# Patient Record
Sex: Female | Born: 1985
Health system: Southern US, Community
[De-identification: ages and names within clinical notes are randomized; demographics above are authoritative.]

## PROBLEM LIST (undated history)

## (undated) ENCOUNTER — Inpatient Hospital Stay (HOSPITAL_COMMUNITY): Payer: Self-pay

## (undated) DIAGNOSIS — N83209 Unspecified ovarian cyst, unspecified side: Secondary | ICD-10-CM

## (undated) DIAGNOSIS — D649 Anemia, unspecified: Secondary | ICD-10-CM

## (undated) DIAGNOSIS — Z789 Other specified health status: Secondary | ICD-10-CM

## (undated) HISTORY — PX: OVARIAN CYST SURGERY: SHX726

---
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
## 2004-06-11 ENCOUNTER — Emergency Department (HOSPITAL_COMMUNITY): Admission: EM | Admit: 2004-06-11 | Discharge: 2004-06-11 | Payer: Self-pay | Admitting: Emergency Medicine

## 2004-07-08 ENCOUNTER — Emergency Department (HOSPITAL_COMMUNITY): Admission: EM | Admit: 2004-07-08 | Discharge: 2004-07-08 | Payer: Self-pay | Admitting: Emergency Medicine

## 2004-12-26 ENCOUNTER — Emergency Department (HOSPITAL_COMMUNITY): Admission: EM | Admit: 2004-12-26 | Discharge: 2004-12-27 | Payer: Self-pay | Admitting: Emergency Medicine

## 2005-09-11 ENCOUNTER — Ambulatory Visit (HOSPITAL_COMMUNITY): Admission: RE | Admit: 2005-09-11 | Discharge: 2005-09-11 | Payer: Self-pay | Admitting: Obstetrics

## 2005-10-27 ENCOUNTER — Ambulatory Visit (HOSPITAL_COMMUNITY): Admission: RE | Admit: 2005-10-27 | Discharge: 2005-10-27 | Payer: Self-pay | Admitting: Obstetrics

## 2006-02-02 ENCOUNTER — Ambulatory Visit (HOSPITAL_COMMUNITY): Admission: RE | Admit: 2006-02-02 | Discharge: 2006-02-02 | Payer: Self-pay | Admitting: Obstetrics

## 2006-02-07 ENCOUNTER — Inpatient Hospital Stay (HOSPITAL_COMMUNITY): Admission: AD | Admit: 2006-02-07 | Discharge: 2006-02-08 | Payer: Self-pay | Admitting: Obstetrics

## 2006-02-08 ENCOUNTER — Inpatient Hospital Stay (HOSPITAL_COMMUNITY): Admission: AD | Admit: 2006-02-08 | Discharge: 2006-02-08 | Payer: Self-pay | Admitting: Obstetrics

## 2006-02-19 ENCOUNTER — Inpatient Hospital Stay (HOSPITAL_COMMUNITY): Admission: AD | Admit: 2006-02-19 | Discharge: 2006-02-22 | Payer: Self-pay | Admitting: Obstetrics

## 2006-05-23 ENCOUNTER — Emergency Department (HOSPITAL_COMMUNITY): Admission: EM | Admit: 2006-05-23 | Discharge: 2006-05-24 | Payer: Self-pay | Admitting: Emergency Medicine

## 2006-10-23 ENCOUNTER — Emergency Department (HOSPITAL_COMMUNITY): Admission: EM | Admit: 2006-10-23 | Discharge: 2006-10-23 | Payer: Self-pay | Admitting: Emergency Medicine

## 2006-12-31 ENCOUNTER — Emergency Department (HOSPITAL_COMMUNITY): Admission: EM | Admit: 2006-12-31 | Discharge: 2006-12-31 | Payer: Self-pay | Admitting: Emergency Medicine

## 2007-01-10 ENCOUNTER — Emergency Department (HOSPITAL_COMMUNITY): Admission: EM | Admit: 2007-01-10 | Discharge: 2007-01-11 | Payer: Self-pay | Admitting: Emergency Medicine

## 2009-10-19 ENCOUNTER — Ambulatory Visit: Payer: Self-pay | Admitting: Family Medicine

## 2009-11-10 ENCOUNTER — Emergency Department (HOSPITAL_COMMUNITY): Admission: EM | Admit: 2009-11-10 | Discharge: 2009-11-10 | Payer: Self-pay | Admitting: Emergency Medicine

## 2010-01-27 ENCOUNTER — Encounter: Payer: Self-pay | Admitting: Physician Assistant

## 2010-01-27 ENCOUNTER — Emergency Department (HOSPITAL_COMMUNITY): Admission: EM | Admit: 2010-01-27 | Discharge: 2010-01-27 | Payer: Self-pay | Admitting: Emergency Medicine

## 2010-04-22 ENCOUNTER — Telehealth: Payer: Self-pay | Admitting: Physician Assistant

## 2010-04-22 ENCOUNTER — Ambulatory Visit: Payer: Self-pay | Admitting: Physician Assistant

## 2010-04-22 DIAGNOSIS — N9489 Other specified conditions associated with female genital organs and menstrual cycle: Secondary | ICD-10-CM | POA: Insufficient documentation

## 2010-04-22 LAB — CONVERTED CEMR LAB
ALT: 14 units/L (ref 0–35)
AST: 18 units/L (ref 0–37)
Albumin: 4.5 g/dL (ref 3.5–5.2)
Alkaline Phosphatase: 63 units/L (ref 39–117)
Bilirubin Urine: NEGATIVE
CO2: 26 meq/L (ref 19–32)
Chloride: 106 meq/L (ref 96–112)
Glucose, Bld: 78 mg/dL (ref 70–99)
Glucose, Urine, Semiquant: NEGATIVE
Hemoglobin: 12.4 g/dL (ref 12.0–15.0)
Ketones, urine, test strip: NEGATIVE
Neutro Abs: 2.3 10*3/uL (ref 1.7–7.7)
Nitrite: NEGATIVE
Potassium: 4.5 meq/L (ref 3.5–5.3)
RBC: 4.21 M/uL (ref 3.87–5.11)
RDW: 13.3 % (ref 11.5–15.5)
Specific Gravity, Urine: >=1.03
TSH: 2.206 microintl units/mL (ref 0.350–4.500)
WBC: 5.4 10*3/uL (ref 4.0–10.5)
pH: 5.5

## 2010-04-23 ENCOUNTER — Encounter: Payer: Self-pay | Admitting: Physician Assistant

## 2010-04-25 ENCOUNTER — Ambulatory Visit: Payer: Self-pay | Admitting: Physician Assistant

## 2010-04-26 ENCOUNTER — Encounter: Payer: Self-pay | Admitting: Physician Assistant

## 2010-04-26 LAB — CONVERTED CEMR LAB
Amphetamine Screen, Ur: NEGATIVE
Barbiturate Quant, Ur: NEGATIVE
Cocaine Metabolites: NEGATIVE
Marijuana Metabolite: POSITIVE — AB
Opiate Screen, Urine: NEGATIVE
Phencyclidine (PCP): NEGATIVE
Propoxyphene: NEGATIVE

## 2010-04-29 ENCOUNTER — Ambulatory Visit (HOSPITAL_COMMUNITY): Admission: RE | Admit: 2010-04-29 | Discharge: 2010-04-29 | Payer: Self-pay | Admitting: Internal Medicine

## 2010-04-29 ENCOUNTER — Telehealth: Payer: Self-pay | Admitting: Physician Assistant

## 2010-04-30 ENCOUNTER — Telehealth: Payer: Self-pay | Admitting: Physician Assistant

## 2010-05-03 ENCOUNTER — Encounter: Payer: Self-pay | Admitting: Physician Assistant

## 2010-05-17 ENCOUNTER — Telehealth: Payer: Self-pay | Admitting: Physician Assistant

## 2010-06-04 ENCOUNTER — Ambulatory Visit: Payer: Self-pay | Admitting: Internal Medicine

## 2010-06-04 DIAGNOSIS — B9789 Other viral agents as the cause of diseases classified elsewhere: Secondary | ICD-10-CM | POA: Insufficient documentation

## 2010-06-19 ENCOUNTER — Ambulatory Visit (HOSPITAL_COMMUNITY): Admission: RE | Admit: 2010-06-19 | Discharge: 2010-06-19 | Payer: Self-pay | Admitting: Internal Medicine

## 2010-06-19 DIAGNOSIS — R1909 Other intra-abdominal and pelvic swelling, mass and lump: Secondary | ICD-10-CM | POA: Insufficient documentation

## 2010-06-27 ENCOUNTER — Ambulatory Visit: Payer: Self-pay | Admitting: Obstetrics and Gynecology

## 2010-08-08 ENCOUNTER — Ambulatory Visit: Payer: Self-pay | Admitting: Family Medicine

## 2010-08-08 ENCOUNTER — Ambulatory Visit (HOSPITAL_COMMUNITY): Admission: RE | Admit: 2010-08-08 | Discharge: 2010-08-08 | Payer: Self-pay | Admitting: Family Medicine

## 2010-08-26 ENCOUNTER — Ambulatory Visit: Payer: Self-pay | Admitting: Physician Assistant

## 2010-08-26 ENCOUNTER — Emergency Department (HOSPITAL_COMMUNITY)
Admission: EM | Admit: 2010-08-26 | Discharge: 2010-08-26 | Payer: Self-pay | Source: Home / Self Care | Admitting: Emergency Medicine

## 2010-10-22 NOTE — Letter (Signed)
Summary: *HSN Results Follow up  [facility]  [address] [place], [place] [address]   Phone: [phone]  Fax: [phone]      04/23/2010   Janice Carney [address] [place], [place]  [address]   Dear  [name],                            ____S.Drinkard,FNP   ____D. Gore,FNP       ____B. McPherson,MD   ____V. Rankins,MD    ____E. Mulberry,MD    ____N. Daphine Deutscher, FNP  ____D. Reche Dixon, MD    ____K. Philipp Deputy, MD    __x__S. Alben Spittle, PA-C     This letter is to inform you that your recent test(s):  _______Pap Smear    ___x____Lab Test     _______X-ray    ___x____ is normal  _______ requires a medication change  _______ requires a follow-up lab visit  _______ requires a follow-up visit with your Swannie Milius   Comments: Please call me if you do not receive a call about your referral to the gynecologist in the next 2 weeks.       _________________________________________________________ If you have any questions, please contact our office                     Sincerely,  Tereso Newcomer PA-C [facility]

## 2010-10-22 NOTE — Letter (Signed)
Summary: TRANSABDINAL & TRANSVAGINAL OF PELVIS  TRANSABDINAL & TRANSVAGINAL OF PELVIS   Imported By: Arta Bruce 05/14/2010 15:11:43  _____________________________________________________________________  External Attachment:    Type:   Image     Comment:   External Document

## 2010-10-22 NOTE — Letter (Signed)
Summary: RADIOLOGY//APPOINTMENT  RADIOLOGY//APPOINTMENT   Imported By: Arta Bruce 05/03/2010 14:26:53  _____________________________________________________________________  External Attachment:    Type:   Image     Comment:   External Document

## 2010-10-22 NOTE — Letter (Signed)
Summary: REFERRAL//GYNECOLOGIC//APPT DATE & TIME  REFERRAL//GYNECOLOGIC//APPT DATE & TIME   Imported By: Arta Bruce 05/17/2010 15:09:46  _____________________________________________________________________  External Attachment:    Type:   Image     Comment:   External Document

## 2010-10-22 NOTE — Progress Notes (Signed)
Summary: f/u Pelvic Ultrasound  Phone Note Outgoing Call   Summary of Call: Make sure she is referred to GYN. I still cannot find out who ordered this u/s.  I did not.  I have no idea how it got ordered.  An order was sent over from radiology because GYN was sending her over, but she has never seen GYN.  I am confused.  Regardless, arrange f/u pelvic and transvaginal ultrasound in 6 weeks.  Order in basket.  Tell patient she has a cyst that needs to be followed.  Also let her know we are working on the GYN referral. Initial call taken by: Brynda Rim,  May 17, 2010 2:54 PM  Follow-up for Phone Call        pt states she already had a u/s on 04/29/10  Additional Follow-up for Phone Call Additional follow up Details #1::        I know she had an u/s on 8/8. She needs another one in 6 weeks to follow up. Please make sure she is getting appt with GYN. Tereso Newcomer PA-C  May 20, 2010 1:47 PM     Additional Follow-up for Phone Call Additional follow up Details #2::    OK. FOLLOW UP U/S SCHEDULED FOR 06/19/10 @ 830. PT AWARE AND ORDER FAXED ALSO NORA HAS FAXED REFERRAL TODAY WAITING ON APPT DATE Follow-up by: Michelle Nasuti,  May 20, 2010 4:15 PM

## 2010-10-22 NOTE — Assessment & Plan Note (Signed)
Summary: ABDOMINAL PAINS///KT   Vital Signs:  Patient profile:   25 year old female Height:      62 inches Weight:      112 pounds BMI:     20.56 Temp:     97.6 degrees F Pulse rate:   65 / minute Pulse rhythm:   regular Resp:     18 per minute BP sitting:   113 / 75  (right arm) Cuff size:   regular  Vitals Entered By: Dutch Quint RN (April 22, 2010 10:00 AM) CC: new patient visit, abdominal pain for about .  periodically, previous ER visit in May Pain Assessment Patient in pain? no       Does patient need assistance? Functional Status Self care Ambulation Normal   Primary Care Provider:  Tereso Newcomer, PA-C  CC:  new patient visit, abdominal pain for about .  periodically, and previous ER visit in May.  History of Present Illness: New patient. Has a h/o pelvic pain for the last several mos.  Went to ED in May and had an ultrasound that demonstrated normal uterus and ovaries, but a question of pelvic congestion syndrome as documented by multiple pelvic veins. She was treat for possible PID at her visit to the ED.  But, her GC and chlamydia probes returned negative.  She never went to the  Health Dept after going to the ED.  It was rec. she see GYN, but never did.  Given doxycycline.  She developed nausea and vomiting and hand numbness with med.  All symptoms resolved since finishing med.  Pain worse with cycle.  Pain is bilateral.  Notes irreg menses her whole life.  Was having more irreg bleeding before going to ED.  Was also having bleeding with sex.  No longer having this.  Pain can occur at any time, but worse with cycles.  Pain worse with standing or walking.  Feels like pressure.  Notes an ache after sex at times as well.  Habits & Providers  Alcohol-Tobacco-Diet     Tobacco Status: quit  Exercise-Depression-Behavior     Drug Use: yes  Current Medications (verified): 1)  None  Allergies (verified): No Known Drug Allergies  Past History:  Past  Medical History: Pelvic Congestion Syndrome by vaginal ultrasound in 01/2010  Past Surgical History: Denies surgical history  Family History: HTN - mom Colon CA - dad in his 62s ? CAD - grandmother Breast CA - PGM  Social History: Former Smoker  a.  smoked from age 15 to 54 - 1ppd Alcohol use-no  Drug use-yes   a.  marijuana Single 1 child G1P1 + sexually active with one partner Smoking Status:  quit Drug Use:  yes  Review of Systems  The patient denies fever, dyspnea on exertion, melena, and hematochezia.    Physical Exam  General:  alert, well-developed, and well-nourished.   Head:  normocephalic and atraumatic.   Eyes:  pupils equal, pupils round, and pupils reactive to light.   Neck:  supple and no thyromegaly.   Lungs:  normal breath sounds, no crackles, and no wheezes.   Heart:  normal rate and regular rhythm.   Abdomen:  soft, normal bowel sounds, and no hepatomegaly.   + tend with palp bilat lower pelvis  Neurologic:  alert & oriented X3 and cranial nerves II-XII intact.   Psych:  normally interactive and good eye contact.     Impression & Recommendations:  Problem # 1:  PREVENTIVE HEALTH CARE (ICD-V70.0)  Orders: T-Comprehensive Metabolic Panel [phone]) T-CBC w/Diff [id number]) T-TSH [phone]) T-Urinalysis [id number]) T-Syphilis Test (RPR) [id number]) T-HIV Antibody  (Reflex) [id number]) T-Drug Screen-Urine, (single) ([id number])  Problem # 2:  SYNDROME, PELVIC CONGESTION (ICD-625.5) refer to gyn  Orders: T-Comprehensive Metabolic Panel ([id number]) T-CBC w/Diff ([id number]) Gynecologic Referral (Gyn)  Patient Instructions: 1)  You can take Tylenol 500 mg (Acetaminophen) 1-2 tabs by mouth every 6 hours as needed. 2)  You can take Ibuprofen 200 mg 2-3 tabs by mouth every 6-8 hours with food as needed for pain.  Try taking when your cycle starts to help the pain. 3)  Schedule a CPP (pap smear) with Lorin Picket if the gynecology  clinic does not do one. 4)  Someone should call you about seeing gynecology.  If you do not hear anything in 2 weeks, call me.  Laboratory Results   Urine Tests    Routine Urinalysis   Color: yellow Appearance: Cloudy Glucose: negative   (Normal Range: Negative) Bilirubin: negative   (Normal Range: Negative) Ketone: negative   (Normal Range: Negative) Spec. Gravity: >=1.030   (Normal Range: 1.003-1.035) Blood: negative   (Normal Range: Negative) pH: 5.5   (Normal Range: 5.0-8.0) Protein: negative   (Normal Range: Negative) Urobilinogen: 0.2   (Normal Range: 0-1) Nitrite: negative   (Normal Range: Negative) Leukocyte Esterace: trace   (Normal Range: Negative)

## 2010-10-22 NOTE — Progress Notes (Signed)
Summary: [facility] needs an order//gk  Phone Note From Other Clinic   Summary of Call: [facility] sent an order on Friday because she has been scheduled for today at 11:5 for an ultrasound vaginal and they cannot do it until they have the order.  If you have any further questions please contact [facility] office [phone] and fax [phone].  Please do this right away.   Alben Spittle PA-c  Initial call taken by: Manon Hilding,  April 29, 2010 10:10 AM  Follow-up for Phone Call        order faxed Follow-up by: [place] Shannon,  April 29, 2010 10:16 AM

## 2010-10-22 NOTE — Progress Notes (Signed)
Summary: transvag ultrasound  Phone Note Outgoing Call   Summary of Call: Please call GYN clinic and ask if they ordered the transvaginal u/s. I did not order it.  Initial call taken by: Tereso Newcomer PA-C,  April 30, 2010 9:13 PM  Follow-up for Phone Call        spoke with lady and she wants me to call and speak with an Antoinette @ 4:30.... Armenia Shannon  May 01, 2010 12:46 PM  Did you find out who ordered this test? Tereso Newcomer PA-C  May 02, 2010 5:20 PM   Additional Follow-up for Phone Call Additional follow up Details #1::        Results on your desk. Gaylyn Cheers RN  May 03, 2010 9:48 AM   I did not order this test. Please check with GYN clinic and make sure they ordered it.  They need the results because she needs f/u on this. Tereso Newcomer PA-C  May 03, 2010 2:35 PM     Additional Follow-up for Phone Call Additional follow up Details #2::    spoke with Antoniette in radiololgy and she let me know the pelvic exam was cancelled because there was a pelvic and transvaginal recently done on may 8 and they normal will just reelevate her within 6 months... she said their should be some recommendations at the bottom of report. but they did not referr her no where Follow-up by: Arm[place] Shannon,  May 06, 2010 9:12 AM  Additional Follow-up for Phone Call Additional follow up Details #3:: Details for Additional Follow-up Action Taken: Has she been referred to Gynecology yet? Please find out when she was seen.  Request office visit note and put on my desk. If not seen yet, she needs appt.  Please schedule. Tereso Newcomer PA-C  May 14, 2010 4:44 PM   she has not been referred to gyn yet...will make appt  Armenia Shannon  May 15, 2010 8:05 AM    Appended Document: transvag ultrasound spoke with radiology and she said pt needs pelvic mri or transvaginal u/s 6-12 weeks.... we have to schedule the appt not them.Marland KitchenMarland KitchenMarland Kitchen

## 2010-10-22 NOTE — Progress Notes (Signed)
  Phone Note Outgoing Call   Summary of Call: Please culture urine. Tereso Newcomer PA-C  April 22, 2010 5:50 PM   Follow-up for Phone Call        urine did not get sent.... pt will need to come in to give another urine Follow-up by: Arm[place] Shannon,  April 23, 2010 12:13 PM     Appended Document:  have her come in to repeat Tereso Newcomer PA-C  April 23, 2010 12:15 PM  pt is aware... Armenia Shannon  April 23, 2010 4:07 PM   Clinical Lists Changes

## 2010-10-22 NOTE — Progress Notes (Signed)
Summary: GYN referral - appt. 04/29/2010  Phone Note Outgoing Call   Summary of Call: Needs referral to GYN for pelvic congestion syndrome.  Initial call taken by: Tereso Newcomer PA-C,  April 22, 2010 5:51 PM  Follow-up for Phone Call        I MADE HER AN APPT 04-29-10 @ 11:15 AM @ [facility] PT AWARE OF THE APPT  Follow-up by: Cheryll Dessert,  April 25, 2010 4:56 PM

## 2010-10-22 NOTE — Assessment & Plan Note (Signed)
Summary: Viral Syndrome   Vital Signs:  Patient profile:   25 year old female Height:      62 inches Weight:      107 pounds BMI:     19.64 Temp:     97.8 degrees F oral Pulse rate:   80 / minute Pulse rhythm:   regular Resp:     18 per minute BP sitting:   117 / 78  (left arm) Cuff size:   regular  Vitals Entered By: CMA Student [name] CC: office visit for vomitting , conjestion and soar throat onset for two days, medications verified Is Patient Diabetic? No Pain Assessment Patient in pain? no       Does patient need assistance? Functional Status Self care Ambulation Normal   Primary Care [name]:  Tereso Newcomer, PA-C  CC:  office visit for vomitting , conjestion and soar throat onset for two days, and medications verified.  History of Present Illness: Sick since yesterday.  No fever.  Notes vomiting (3x today).  No diarrhea.  No hematemesis.  No melena or hematochezia.  Notes some cramping.  Took some cough syrup.   + cough + congestion No sputum production.   + sore throat No rashes. No otalgia. Son sick over weekend.  Current Medications (verified): 1)  None  Allergies (verified): No Known Drug Allergies  Physical Exam  General:  WN/WD female who obviously feels poorly; lying on bed as I walk in room Head:  normocephalic and atraumatic.   Eyes:  pupils equal, pupils round, and pupils reactive to light.   Ears:  R ear normal and L ear normal.   Nose:  no nasal discharge.   Mouth:  pharynx pink and moist, no erythema, and no exudates.   Neck:  supple and no cervical lymphadenopathy.   Lungs:  normal breath sounds, no crackles, and no wheezes.   Heart:  normal rate and regular rhythm.   Abdomen:  soft, non-tender, normal bowel sounds, and no hepatomegaly.   Neurologic:  alert & oriented X3 and cranial nerves II-XII intact.   Psych:  normally interactive.     Impression & Recommendations:  Problem # 1:  VIRAL INFECTION  (ICD-079.99)  symptomatic tx f/u as needed  Her updated medication list for this problem includes:    Zyrtec-d Allergy & Congestion 5-120 Mg Xr12h-tab (Cetirizine-pseudoephedrine) .Marland Kitchen... Take 1 tablet by mouth two times a day as needed for congestion  Orders: Rapid Strep ([id number])  Complete Medication List: 1)  Promethazine Hcl 25 Mg Tabs (Promethazine hcl) .... 1/2 to 1 tab by mouth once daily as needed for nausea 2)  Zyrtec-d Allergy & Congestion 5-120 Mg Xr12h-tab (Cetirizine-pseudoephedrine) .... Take 1 tablet by mouth two times a day as needed for congestion  Patient Instructions: 1)  Take 650 - 1000 mg of tylenol every 4-6 hours as needed for relief of pain or comfort of fever. Avoid taking more than 4000 mg in a 24 hour period( can cause liver damage in higher doses).  2)  Your cough syrup is ok to use as needed. 3)  Use zyrtec-D two times a day as needed for congestion and drainaged. 4)  Use the phenergan for nausea if needed. 5)  Get plenty of rest.  Drink plenty of fluids. 6)  Maintain a clear liquid diet for 1 day.   7)  Advance to a BRAT diet - 8)  B-bananas 9)  R-rice 10)  A-apples 11)  T-toast 12)  Do this for 1-2  days. 13)  Advance your diet slowly as you can tolerate. 14)  Your symptoms should gradually get better over the next 7-10 days. 15)  Follow up with Lorin Picket if your symptoms do not get better or sooner if you feel worse. Prescriptions: ZYRTEC-D ALLERGY & CONGESTION 5-120 MG XR12H-TAB (CETIRIZINE-PSEUDOEPHEDRINE) Take 1 tablet by mouth two times a day as needed for congestion  #30 x 0   Entered and Authorized by:   Tereso Newcomer PA-C   Signed by:   Tereso Newcomer PA-C on 06/04/2010   Method used:   Print then Give to Patient   RxID:   [id number] PROMETHAZINE HCL 25 MG TABS (PROMETHAZINE HCL) 1/2 to 1 tab by mouth once daily as needed for nausea  #10 x 0   Entered and Authorized by:   Tereso Newcomer PA-C   Signed by:   Tereso Newcomer PA-C on 06/04/2010   Method  used:   Print then Give to Patient   RxID:   [id number]

## 2010-11-13 ENCOUNTER — Ambulatory Visit (INDEPENDENT_AMBULATORY_CARE_PROVIDER_SITE_OTHER): Payer: Self-pay | Admitting: Family Medicine

## 2010-11-13 DIAGNOSIS — N949 Unspecified condition associated with female genital organs and menstrual cycle: Secondary | ICD-10-CM

## 2010-12-02 LAB — POCT PREGNANCY, URINE: Preg Test, Ur: NEGATIVE

## 2010-12-03 LAB — SURGICAL PCR SCREEN
MRSA, PCR: NEGATIVE
Staphylococcus aureus: NEGATIVE

## 2010-12-03 LAB — CBC
HCT: 38.5 % (ref 36.0–46.0)
Hemoglobin: 13 g/dL (ref 12.0–15.0)
MCH: 30.6 pg (ref 26.0–34.0)
RBC: 4.24 MIL/uL (ref 3.87–5.11)
RDW: 13.5 % (ref 11.5–15.5)

## 2010-12-10 LAB — URINALYSIS, ROUTINE W REFLEX MICROSCOPIC: Specific Gravity, Urine: 1.035 — ABNORMAL HIGH (ref 1.005–1.030)

## 2010-12-10 LAB — URINE MICROSCOPIC-ADD ON

## 2010-12-10 LAB — WET PREP, GENITAL: Yeast Wet Prep HPF POC: NONE SEEN

## 2010-12-10 LAB — POCT PREGNANCY, URINE: Preg Test, Ur: NEGATIVE

## 2010-12-11 ENCOUNTER — Ambulatory Visit (INDEPENDENT_AMBULATORY_CARE_PROVIDER_SITE_OTHER): Payer: Self-pay | Admitting: Obstetrics & Gynecology

## 2010-12-11 DIAGNOSIS — N949 Unspecified condition associated with female genital organs and menstrual cycle: Secondary | ICD-10-CM

## 2010-12-13 NOTE — Progress Notes (Signed)
Janice Carney, Janice Carney              ACCOUNT NO.:  [id number]  MEDICAL RECORD NO.:  [id number]           PATIENT TYPE:  A  LOCATION:  [facility]                   FACILITY:  [facility]  PHYSICIAN:  Tinnie Gens, MD        DATE OF BIRTH:  [birth year]  DATE OF SERVICE:  11/13/2010                                 CLINIC NOTE  CHIEF COMPLAINT:  Pelvic pain.  HISTORY OF PRESENT ILLNESS:  The patient is a 25 year old gravida 1, para 1 who underwent diagnostic laparoscopy with I and D of right hydrosalpinx in November. She continues to have left lower quadrant pain which she had previously with surgery, that seems to be worse when she is up and working.  Review of her surgery and pictures reveals normal- appearing pelvic anatomy, especially on the left side.  I am unclear that this is related to her pain.  She is wondering if something at the time of surgery could be causing her pain, however, pain was present prior to surgery.  PHYSICAL EXAMINATION:  VITAL SIGNS:  As noted in the chart. GENERAL:  She is a well-developed, well-nourished female in no acute distress. ABDOMEN:  Soft, nontender, nondistended. GU:  Normal external female genitalia.  BUS normal.  Vagina is pink and rugated.  Cervix is parous without lesion.  Uterus is small retroverted. No adnexal mass.  The patient reports some mild tenderness on exam.  IMPRESSION:  Left-sided chronic pelvic pain of unclear etiology.  PLAN:  Trial of antiinflammatories x2-4 weeks.  If this fails to significantly improve her pain, we would consider GI and Urology referral. Discussed musculoskeletal as possible etiology of her pain and I am unclear if this is at all related to anything GYN.          ______________________________ Tinnie Gens, MD    TP/MEDQ  D:  11/13/2010  T:  11/14/2010  Job:  [id number]

## 2010-12-13 NOTE — Progress Notes (Signed)
NAMECATHY, CROUNSE              ACCOUNT NO.:  [id number]  MEDICAL RECORD NO.:  [id number]           PATIENT TYPE:  A  LOCATION:  [facility]                   FACILITY:  [facility]  PHYSICIAN:  Scheryl Darter, MD       DATE OF BIRTH:  1986-02-03  DATE OF SERVICE:  12/11/2010                                 CLINIC NOTE  The patient returns today in followup for her left lower quadrant pain. The patient has a history of left lower quadrant pain and she had diagnostic laparoscopy in November.  She is 25 years old gravida 1, para 1, last menstrual period November 21, 2010.  Currently not using any contraception.  She is somewhat ambivalent about whether she wants to conceive right now or not.  She occasionally has left lower quadrant pain which feels like pulling.  She says it is little bit less frequent now than when she was last seen here.  She says her last menstrual period came irregularly and somewhat heavy and painful.  She has a history of chronic pain and has had back problems and "pinched nerve" in the past.  She was seen by back specialist and chiropractor.  She no longer goes because she has no insurance.  It is unsure whether she would like to try chiropractic again.  She would like to, but the insurance issues could be a problem.  She will see the chiropractor if possible.  We discussed contraception and cycle control and I offered her to try oral contraceptives.  This should afford better cycle control and be good to see whether her pain is affected at all under this therapy.  She requested Ortho Tri-Cyclen.  I gave her prescription which she will start soon.  She will return in 3 months to review her progress.     Scheryl Darter, MD    JA/MEDQ  D:  12/11/2010  T:  12/12/2010  Job:  [id number]

## 2011-03-29 ENCOUNTER — Emergency Department (HOSPITAL_COMMUNITY)
Admission: EM | Admit: 2011-03-29 | Discharge: 2011-03-29 | Disposition: A | Discharge status: Home / Self Care | Payer: Self-pay | Attending: Emergency Medicine | Admitting: Emergency Medicine

## 2011-03-29 DIAGNOSIS — W540XXA Bitten by dog, initial encounter: Secondary | ICD-10-CM | POA: Insufficient documentation

## 2011-03-29 DIAGNOSIS — S8010XA Contusion of unspecified lower leg, initial encounter: Secondary | ICD-10-CM | POA: Insufficient documentation

## 2011-05-20 IMAGING — US US TRANSVAGINAL NON-OB
1 series · 13 of 25 positions shown · non-contrast
Comparison: 01/27/2010

CLINICAL DATA: Pelvic pain.

TRANSVAGINAL ULTRASOUND OF PELVIS
TECHNIQUE: Transvaginal ultrasound examination of the pelvis was
performed including evaluation of the uterus, ovaries, adnexal
regions, and pelvic cul-de-sac.

[Series 1: us transvaginal non-ob · 48 acquisitions, 13 frames shown]
[im 1/48]
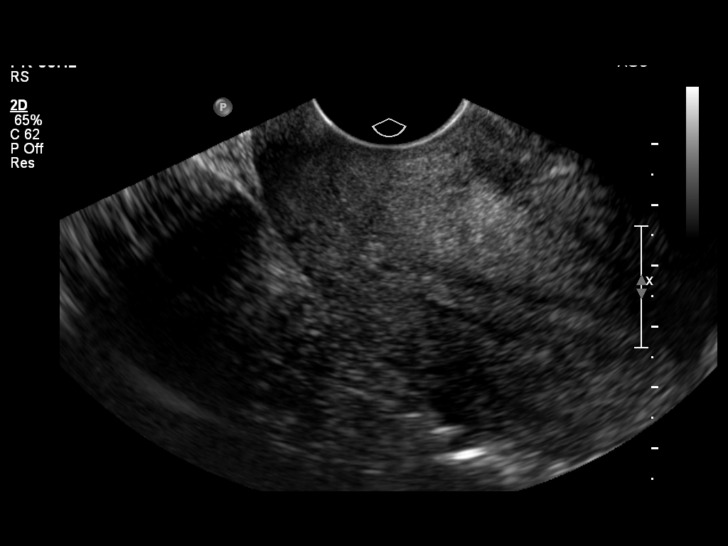
[im 4/48]
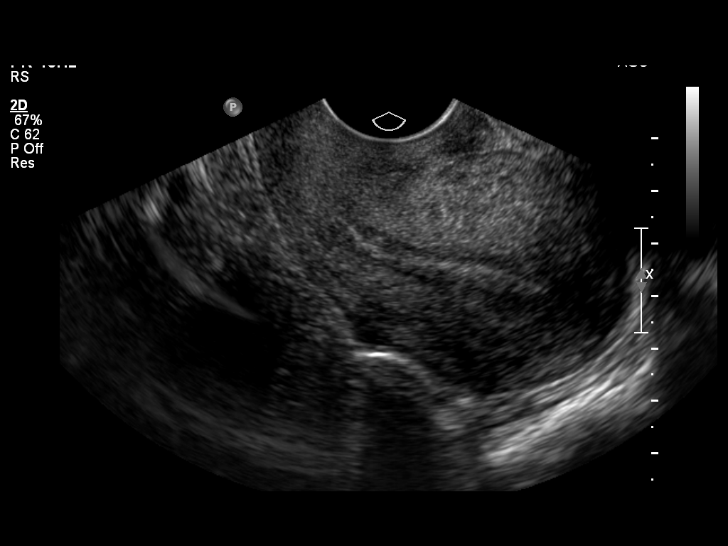
[im 8/48]
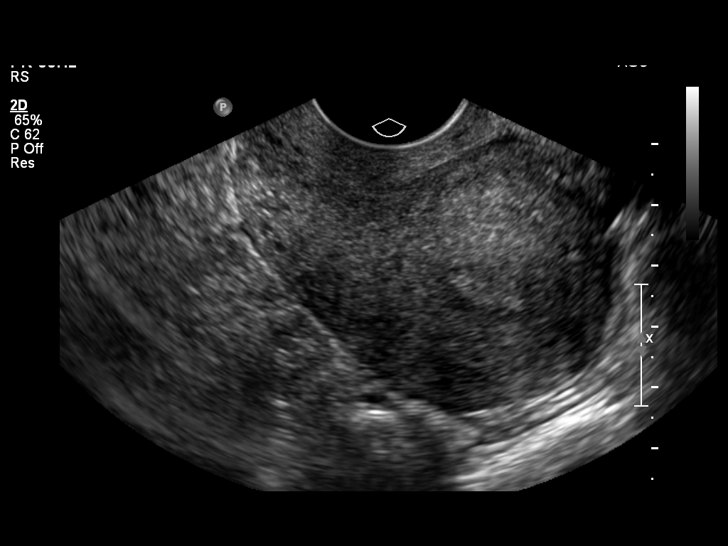
[im 12/48]
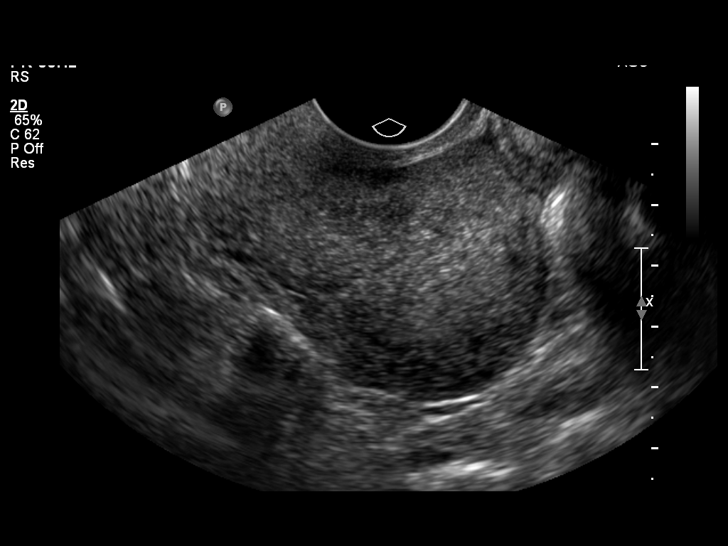
[im 16/48]
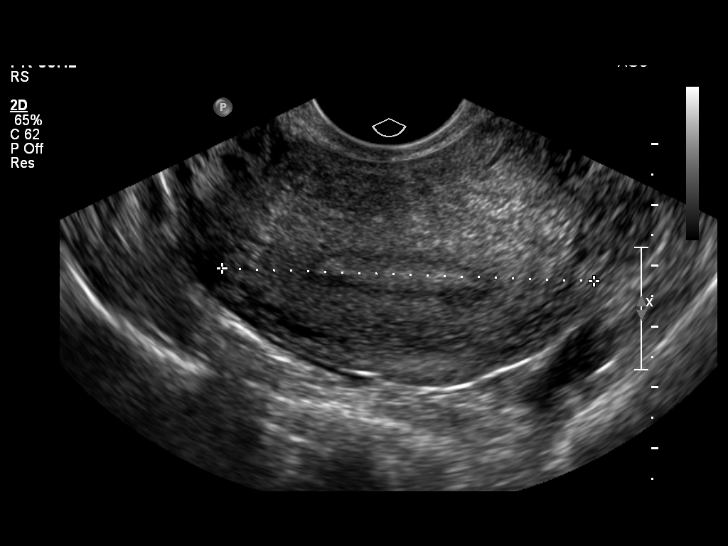
[im 20/48]
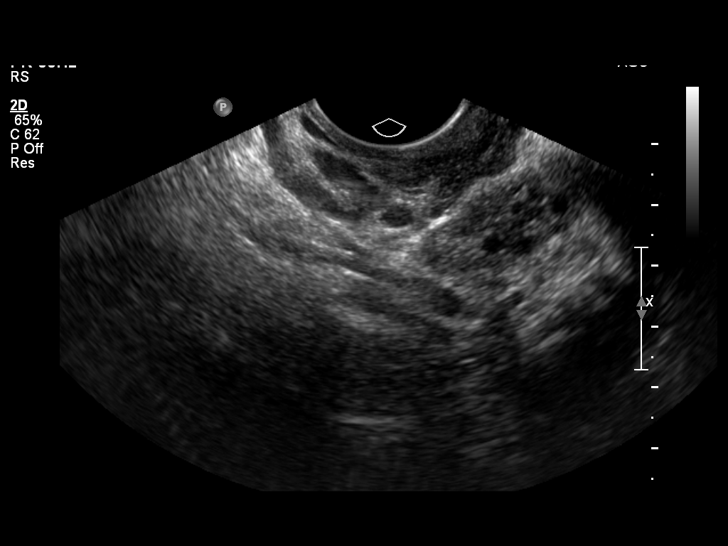
[im 24/48]
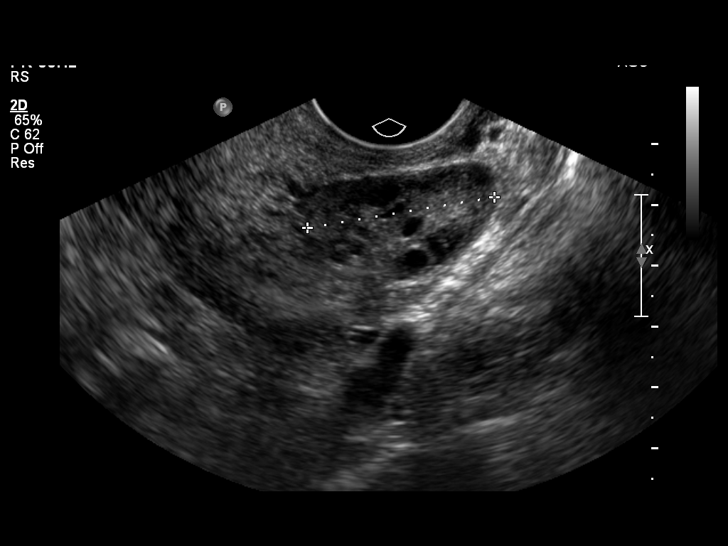
[im 28/48]
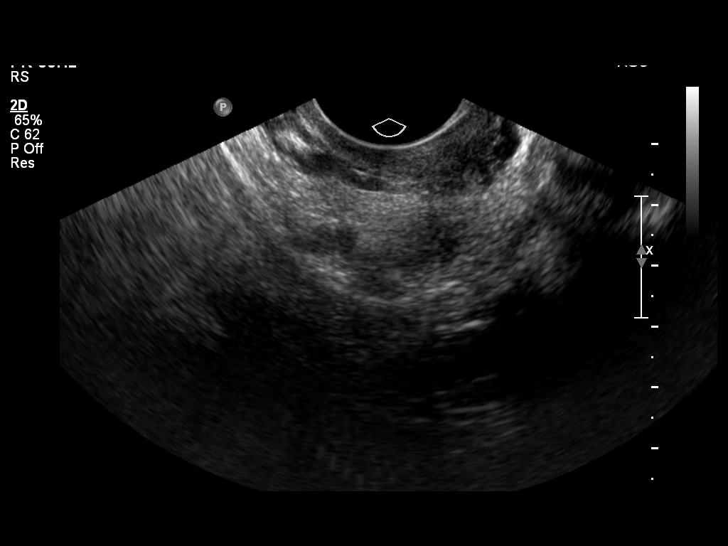
[im 32/48]
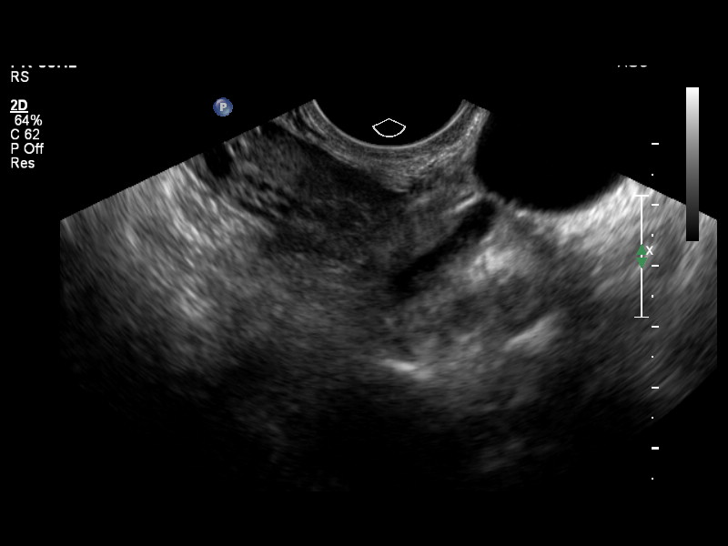
[im 36/48]
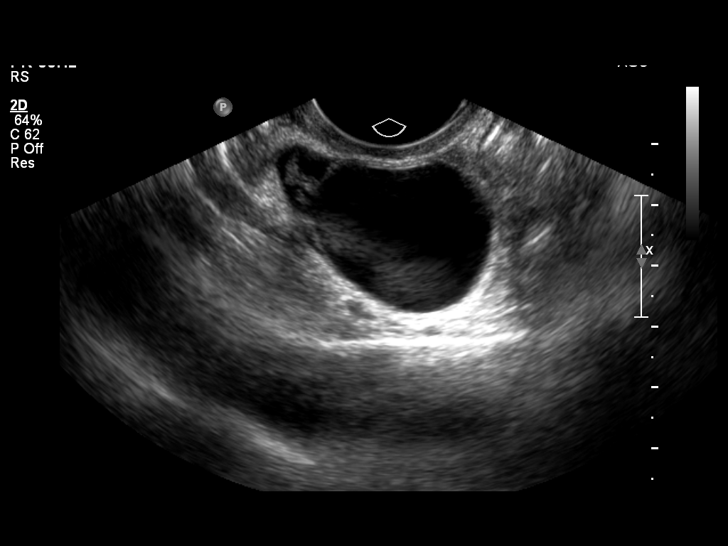
[im 40/48]
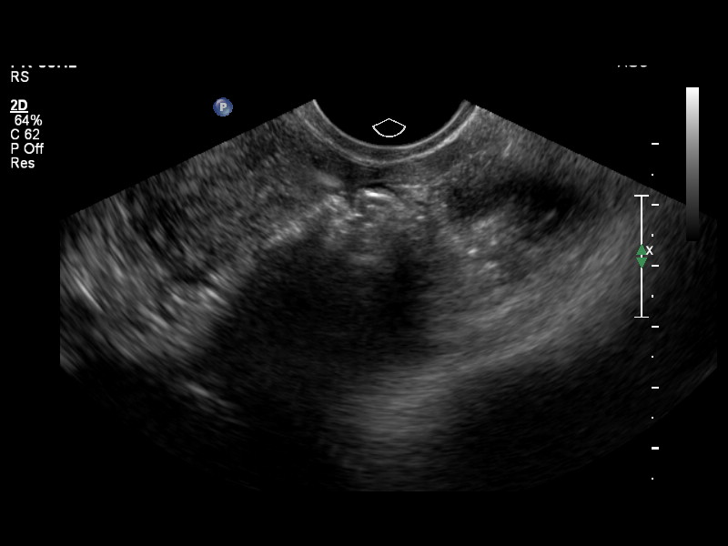
[im 44/48]
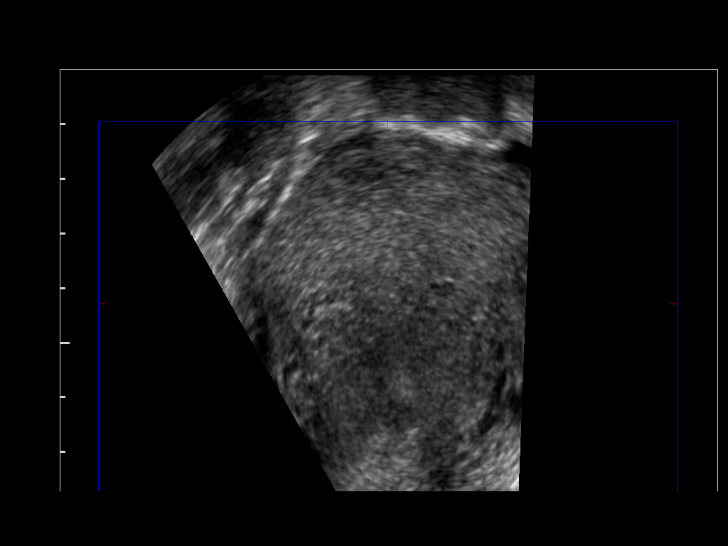
[im 48/48]
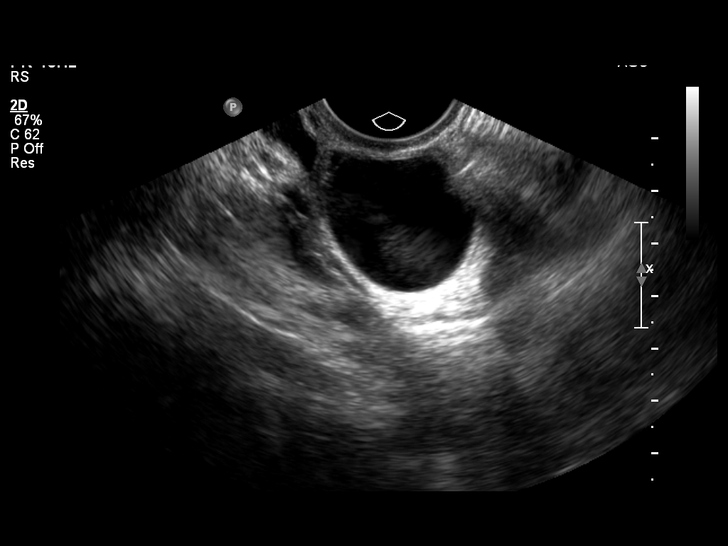

[13 of 25 positions shown; findings below may reference images not displayed]

FINDINGS: Uterus measures 8.1 x 4.6 by 6.1 cm.  Uterus is retroverted.  No
fibroids or other uterine masses identified.

Endometrium measures 4 mm in thickness.  Within normal limits in
appearance.

Right Ovary measures 4.3 x 1.8 x 2.6 cm.  Normal appearance.

Left Ovary measures 3.8 x 1.8 x 3.1 cm.  Normal appearance.

Other Findings:  A fluid filled structure is seen in the right
adnexa separate from the ovary which measures approximately 3 cm.
This has a several peripheral folds raising suspicion for
hydrosalpinx, although differential diagnosis also includes a
paraovarian cyst or peritoneal inclusion cyst.  No malignant
features are identified.
IMPRESSION: 1.  3 cm cystic lesion in the right adnexa which appears separate
from the right ovary, with differential diagnosis including
hydrosalpinx, paraovarian cyst, or peritoneal inclusion cyst.  No
malignant features are identified.  Recommend pelvic MRI without
with contrast for further evaluation, or alternatively, continued
follow-up by transvaginal ultrasound in 6-12 weeks.
2.  Normal appearance of the uterus and left ovary.

## 2011-07-20 ENCOUNTER — Inpatient Hospital Stay (HOSPITAL_COMMUNITY)
Admission: AD | Admit: 2011-07-20 | Discharge: 2011-07-20 | Disposition: A | Discharge status: Home / Self Care | Payer: Self-pay | Source: Ambulatory Visit | Attending: Obstetrics & Gynecology | Admitting: Obstetrics & Gynecology

## 2011-07-20 ENCOUNTER — Encounter (HOSPITAL_COMMUNITY): Payer: Self-pay | Admitting: *Deleted

## 2011-07-20 DIAGNOSIS — N912 Amenorrhea, unspecified: Secondary | ICD-10-CM | POA: Insufficient documentation

## 2011-07-20 HISTORY — DX: Other specified health status: Z78.9

## 2011-07-20 LAB — POCT PREGNANCY, URINE: Preg Test, Ur: NEGATIVE

## 2011-07-20 NOTE — ED Provider Notes (Signed)
Chief Complaint:  No chief complaint on file.   Janice Carney is  25 y.o. G1P1001.  Patient's last menstrual period was 04/07/2011.Marland Kitchen  Her pregnancy status is negative.  She presents complaining of no period since August, breast tenderness and nausea x 3-4 weeks.   Denies abd pain, back pain, abnormal blding, discharge or dysuria  Obstetrical/Gynecological History: OB History    Grav Para Term Preterm Abortions TAB SAB Ect Mult Living   1 1 1       1       Past Medical History: Past Medical History  Diagnosis Date  . No pertinent past medical history     Past Surgical History: Past Surgical History  Procedure Date  . Ovarian cyst surgery     Family History: Family History  Problem Relation Age of Onset  . Hypertension Mother   . Cancer Father   . Heart disease Maternal Grandmother     Social History: History  Substance Use Topics  . Smoking status: Former Smoker -- 5 years    Types: Cigarettes    Quit date: 07/19/2005  . Smokeless tobacco: Not on file  . Alcohol Use: Yes     socially when not pregnant    Allergies: No Known Allergies  Prescriptions prior to admission  Medication Sig Dispense Refill  . naproxen sodium (ANAPROX) 220 MG tablet Take 220 mg by mouth 2 (two) times daily as needed. For pain         Review of Systems - Negative except what has been reviewed in the HPI  Physical Exam   Blood pressure 138/80, pulse 84, temperature 98.2 F (36.8 C), temperature source Oral, resp. rate 16, height 5\' 3"  (1.6 m), weight 57.153 kg (126 lb), last menstrual period 04/07/2011.  General: General appearance - alert, well appearing, and in no distress, oriented to person, place, and time and normal appearing weight Mental status - alert, oriented to person, place, and time, normal mood, behavior, speech, dress, motor activity, and thought processes, affect appropriate to mood Abdomen - soft, nontender, nondistended, no masses or organomegaly Focused  Gynecological Exam: examination not indicated  Labs: Recent Results (from the past 24 hour(s))  POCT PREGNANCY, URINE   Collection Time   07/20/11  9:07 PM      Component Value Range   Preg Test, Ur NEGATIVE    HCG, SERUM, QUALITATIVE   Collection Time   07/20/11  9:24 PM      Component Value Range   Preg, Serum NEGATIVE  NEGATIVE      Assessment: Amenorrhea with negative preg testing  Plan: Discharge home FU with Gyn Clinic or Gyn provider for further evaluation of symptoms   Tawnee Clegg E. 07/20/2011,10:14 PM

## 2011-07-20 NOTE — Progress Notes (Signed)
Pthas had sore breast and  vomiting for the past three weeks.  Last Normal period was 16th July 2012.  No bleeding  Or urinary difficulties.

## 2011-07-22 NOTE — ED Provider Notes (Signed)
Agree with above note.  Janice Bluett H. 07/22/2011 7:52 PM  

## 2012-09-22 NOTE — L&D Delivery Note (Signed)
Delivery Note At 10:24 PM a viable female was delivered via Vaginal, Spontaneous Delivery (Presentation: LOA ).  APGAR: 8, 9; weight 7 lb 3 oz (3260 g).   Placenta status: Intact, Spontaneous, shultz.  Cord: 3 vessels with the following complications: None.  Cord pH: not collected  Pt at high risk of PPH, Cytotec given just after delivery for bleeding precautions.  Anesthesia: Local  Episiotomy:  Lacerations: 2nd degree - Vaginal; Labial Suture Repair: 3-0 and 4-0 Vicryl Est. Blood Loss (mL): 350  Mom to postpartum.  Baby to skin to skin immediately after delivery.  Routine PP orders Breastfeeding Circ - outpt   Dallys Nowakowski 06/18/2013, 12:20 AM

## 2012-11-05 ENCOUNTER — Encounter (HOSPITAL_COMMUNITY): Payer: Self-pay | Admitting: *Deleted

## 2012-11-05 ENCOUNTER — Inpatient Hospital Stay (HOSPITAL_COMMUNITY): Payer: Medicaid Other

## 2012-11-05 ENCOUNTER — Inpatient Hospital Stay (HOSPITAL_COMMUNITY)
Admission: AD | Admit: 2012-11-05 | Discharge: 2012-11-05 | Disposition: A | Discharge status: Home / Self Care | Payer: Medicaid Other | Source: Ambulatory Visit | Attending: Obstetrics & Gynecology | Admitting: Obstetrics & Gynecology

## 2012-11-05 DIAGNOSIS — R109 Unspecified abdominal pain: Secondary | ICD-10-CM | POA: Insufficient documentation

## 2012-11-05 DIAGNOSIS — N760 Acute vaginitis: Secondary | ICD-10-CM

## 2012-11-05 DIAGNOSIS — B9689 Other specified bacterial agents as the cause of diseases classified elsewhere: Secondary | ICD-10-CM

## 2012-11-05 DIAGNOSIS — O34599 Maternal care for other abnormalities of gravid uterus, unspecified trimester: Secondary | ICD-10-CM | POA: Insufficient documentation

## 2012-11-05 DIAGNOSIS — A499 Bacterial infection, unspecified: Secondary | ICD-10-CM

## 2012-11-05 DIAGNOSIS — N83209 Unspecified ovarian cyst, unspecified side: Secondary | ICD-10-CM | POA: Insufficient documentation

## 2012-11-05 DIAGNOSIS — O2390 Unspecified genitourinary tract infection in pregnancy, unspecified trimester: Secondary | ICD-10-CM | POA: Insufficient documentation

## 2012-11-05 LAB — URINALYSIS, ROUTINE W REFLEX MICROSCOPIC
Hgb urine dipstick: NEGATIVE
Nitrite: NEGATIVE
Protein, ur: NEGATIVE mg/dL
Specific Gravity, Urine: 1.015 (ref 1.005–1.030)
Urobilinogen, UA: 0.2 mg/dL (ref 0.0–1.0)
pH: 7 (ref 5.0–8.0)

## 2012-11-05 LAB — OB RESULTS CONSOLE GC/CHLAMYDIA: Chlamydia: NEGATIVE

## 2012-11-05 LAB — HCG, QUANTITATIVE, PREGNANCY: hCG, Beta Chain, Quant, S: 22797 m[IU]/mL — ABNORMAL HIGH (ref <5)

## 2012-11-05 LAB — WET PREP, GENITAL: Yeast Wet Prep HPF POC: NONE SEEN

## 2012-11-05 LAB — URINE MICROSCOPIC-ADD ON

## 2012-11-05 MED ORDER — METRONIDAZOLE 500 MG PO TABS: 500.0000 mg | ORAL_TABLET | Freq: Two times a day (BID) | ORAL | Status: DC

## 2012-11-05 NOTE — MAU Note (Signed)
Pt states that she had a cyst removed from her left side 2 years ago and stated having pain in her left side a few weeks ago and is concerned now because she is pregnant

## 2012-11-05 NOTE — MAU Provider Note (Signed)
History    CSN: [id number]  Arrival date and time: 11/05/12 1313   None    Chief Complaint  Patient presents with  . pain on left side    HPI Janice Carney is a 27 yo G2P1 female at [redacted]w[redacted]d gestation. The patient reports to the MAU complaining of left-sided abdominal pain. She reports that she has had this pain for 3 weeks. She reports that the pain comes and goes and is worse with activity. The patient reports that she works as a [employer] fast streamer, so getting in and out of the truck and heavy lifting are times when the pain is worse. She reports that if she is just sitting down and resting that she has very minimal, if any pain at all. She denies any associated symptoms such as nausea, vomiting, constipation or diarrhea. She also denies any vaginal discharge or bleeding.   She also reports that she has had a "cyst" on her ovary before. She reports that the cyst was removed in 2011, and reports that she has been pain free since then until about 3 weeks ago.   OB History   Grav Para Term Preterm Abortions TAB SAB Ect Mult Living   2 1 1       1       Past Medical History  Diagnosis Date  . No pertinent past medical history     Past Surgical History  Procedure Laterality Date  . Ovarian cyst surgery      Family History  Problem Relation Age of Onset  . Hypertension Mother   . Cancer Father   . Heart disease Maternal Grandmother     History  Substance Use Topics  . Smoking status: Former Smoker -- 5 years    Types: Cigarettes    Quit date: 07/19/2005  . Smokeless tobacco: Not on file  . Alcohol Use: Yes     Comment: socially when not pregnant    Allergies: No Known Allergies  No prescriptions prior to admission    Review of Systems  Constitutional: Negative for fever.  Gastrointestinal: Positive for abdominal pain (lower left side). Negative for nausea, vomiting, diarrhea and constipation.  Genitourinary: Negative for dysuria, urgency and frequency.    Physical  Exam   Blood pressure 116/58, pulse 89, temperature 97.6 F (36.4 C), temperature source Oral, resp. rate 18, height 5\' 4"  (1.626 m), weight 54.432 kg (120 lb), last menstrual period 09/21/2012.  Physical Exam Physical Examination: General appearance - alert, well appearing, and in no distress and oriented to person, place, and time Chest - clear to auscultation, no wheezes, rales or rhonchi, symmetric air entry Heart - normal rate, regular rhythm, normal S1, S2, no murmurs, rubs, clicks or gallops Abdomen - soft, tenderness noted over left-lower abdomen, no tenderness over right lower abdomen, nondistended, no masses or organomegaly Pelvic - normal external genitalia, vulva, vagina, cervix, uterus and adnexa, VULVA: normal appearing vulva with no masses, tenderness or lesions, VAGINA: normal appearing vagina with normal color and discharge, no lesions, CERVIX: normal appearing cervix with mild discharge, cervix without lesions, no cervical motion tenderness UTERUS: uterus is normal size, shape, consistency and nontender, ADNEXA: gravid uterus, normal size and shape of left adnexa, unable to feel right paraovarian cyst, tender left adnexa  Extremities - peripheral pulses normal, no pedal edema, no clubbing or cyanosis  MAU Course  Procedures  Results for orders placed during the hospital encounter of 11/05/12 (from the past 24 hour(s))  URINALYSIS, ROUTINE W REFLEX MICROSCOPIC  Status: Abnormal   Collection Time    11/05/12  1:26 PM      Result Value Range   Color, Urine YELLOW  YELLOW   APPearance CLEAR  CLEAR   Specific Gravity, Urine 1.015  1.005 - 1.030   pH 7.0  5.0 - 8.0   Glucose, UA NEGATIVE  NEGATIVE mg/dL   Hgb urine dipstick NEGATIVE  NEGATIVE   Bilirubin Urine NEGATIVE  NEGATIVE   Ketones, ur NEGATIVE  NEGATIVE mg/dL   Protein, ur NEGATIVE  NEGATIVE mg/dL   Urobilinogen, UA 0.2  0.0 - 1.0 mg/dL   Nitrite NEGATIVE  NEGATIVE   Leukocytes, UA TRACE (*) NEGATIVE  URINE  MICROSCOPIC-ADD ON     Status: Abnormal   Collection Time    11/05/12  1:26 PM      Result Value Range   Squamous Epithelial / LPF MANY (*) RARE   WBC, UA 3-6  <3 WBC/hpf   RBC / HPF 0-2  <3 RBC/hpf   Bacteria, UA MANY (*) RARE   Urine-Other MUCOUS PRESENT    POCT PREGNANCY, URINE     Status: Abnormal   Collection Time    11/05/12  1:34 PM      Result Value Range   Preg Test, Ur POSITIVE (*) NEGATIVE  HCG, QUANTITATIVE, PREGNANCY     Status: Abnormal   Collection Time    11/05/12  1:44 PM      Result Value Range   hCG, Beta Chain, Quant, S 22797 (*) <5 mIU/mL    Assessment and Plan    Adela Glimpse 11/05/2012, 3:05 PM   1. BV (bacterial vaginosis)   2. Ovarian cyst in pregnancy, first trimester     No current facility-administered medications on file prior to encounter.   No current outpatient prescriptions on file prior to encounter.     Follow-up Information   Follow up with provider of your choice. (start prenatal care as soon as possible)      I saw and examined patient along with student and agree with above note.   Breahna Boylen 11/05/2012 5:26 PM

## 2012-11-06 LAB — URINE CULTURE: Colony Count: NO GROWTH

## 2012-11-08 NOTE — MAU Provider Note (Signed)
Attestation of Attending Supervision of Advanced Practitioner (PA/CNM/NP): Evaluation and management procedures were performed by the Advanced Practitioner under my supervision and collaboration.  I have reviewed the Advanced Practitioner's note and chart, and I agree with the management and plan. BV treated with Metronidazole, U/S showed 4.3 cm R paraovarian cyst.  Will continue to monitor, no intervention needed at this point.  Jaynie Collins, MD, FACOG Attending Obstetrician & Gynecologist Faculty Practice, [facility]

## 2013-01-11 LAB — OB RESULTS CONSOLE HEPATITIS B SURFACE ANTIGEN: Hepatitis B Surface Ag: NEGATIVE

## 2013-01-11 LAB — OB RESULTS CONSOLE RUBELLA ANTIBODY, IGM: Rubella: IMMUNE

## 2013-01-11 LAB — OB RESULTS CONSOLE RPR: RPR: NONREACTIVE

## 2013-01-11 LAB — OB RESULTS CONSOLE HIV ANTIBODY (ROUTINE TESTING): HIV: NONREACTIVE

## 2013-03-04 ENCOUNTER — Encounter (HOSPITAL_COMMUNITY): Payer: Self-pay | Admitting: Emergency Medicine

## 2013-03-04 DIAGNOSIS — K089 Disorder of teeth and supporting structures, unspecified: Secondary | ICD-10-CM | POA: Insufficient documentation

## 2013-03-04 DIAGNOSIS — Z87891 Personal history of nicotine dependence: Secondary | ICD-10-CM | POA: Insufficient documentation

## 2013-03-04 DIAGNOSIS — R6883 Chills (without fever): Secondary | ICD-10-CM | POA: Insufficient documentation

## 2013-03-04 DIAGNOSIS — O99891 Other specified diseases and conditions complicating pregnancy: Secondary | ICD-10-CM | POA: Insufficient documentation

## 2013-03-04 NOTE — ED Notes (Signed)
PT. REPORTS LEFT UPPER MOLAR PAIN WITH CAVITY ONSET YESTERDAY UNRELIEVED BY OTC TYLENOL , PT. STATED SHE IS [redacted] WEEKS PREGNANT ( G2P1) .

## 2013-03-05 ENCOUNTER — Emergency Department (HOSPITAL_COMMUNITY)
Admission: EM | Admit: 2013-03-05 | Discharge: 2013-03-05 | Disposition: A | Discharge status: Home / Self Care | Payer: Medicaid Other | Attending: Emergency Medicine | Admitting: Emergency Medicine

## 2013-03-05 ENCOUNTER — Encounter (HOSPITAL_COMMUNITY): Payer: Self-pay | Admitting: Emergency Medicine

## 2013-03-05 DIAGNOSIS — K0889 Other specified disorders of teeth and supporting structures: Secondary | ICD-10-CM

## 2013-03-05 MED ORDER — HYDROCODONE-ACETAMINOPHEN 5-325 MG PO TABS
2.0000 | ORAL_TABLET | Freq: Once | ORAL | Status: AC
  Administered 2013-03-05: 2 via ORAL
  Filled 2013-03-05: qty 2

## 2013-03-05 MED ORDER — HYDROCODONE-ACETAMINOPHEN 5-325 MG PO TABS: ORAL_TABLET | ORAL | Status: DC

## 2013-03-05 MED ORDER — PENICILLIN V POTASSIUM 500 MG PO TABS: 500.0000 mg | ORAL_TABLET | Freq: Three times a day (TID) | ORAL | Status: DC

## 2013-03-05 NOTE — Discharge Instructions (Signed)
 Dental Pain A tooth ache may be caused by cavities (tooth decay). Cavities expose the nerve of the tooth to air and hot or cold temperatures. It may come from an infection or abscess (also called a boil or furuncle) around your tooth. It is also often caused by dental caries (tooth decay). This causes the pain you are having. DIAGNOSIS  Your caregiver can diagnose this problem by exam. TREATMENT   If caused by an infection, it may be treated with medications which kill germs (antibiotics) and pain medications as prescribed by your caregiver. Take medications as directed.  Only take over-the-counter or prescription medicines for pain, discomfort, or fever as directed by your caregiver.  Whether the tooth ache today is caused by infection or dental disease, you should see your dentist as soon as possible for further care. SEEK MEDICAL CARE IF: The exam and treatment you received today has been provided on an emergency basis only. This is not a substitute for complete medical or dental care. If your problem worsens or new problems (symptoms) appear, and you are unable to meet with your dentist, call or return to this location. SEEK IMMEDIATE MEDICAL CARE IF:   You have a fever.  You develop redness and swelling of your face, jaw, or neck.  You are unable to open your mouth.  You have severe pain uncontrolled by pain medicine. MAKE SURE YOU:   Understand these instructions.  Will watch your condition.  Will get help right away if you are not doing well or get worse. Document Released: 09/08/2005 Document Revised: 12/01/2011 Document Reviewed: 04/26/2008 [employer] [employer] [facility] Patient Information 2014 [place], [place].    Narcotic and benzodiazepine use may cause drowsiness, slowed breathing or dependence.  Please use with caution and do not drive, operate machinery or watch young children alone while taking them.  Taking combinations of these medications or drinking alcohol will potentiate these  effects.

## 2013-03-05 NOTE — ED Provider Notes (Signed)
History     CSN: [id number]  Arrival date & time 03/04/13  2330   First MD Initiated Contact with Patient 03/05/13 0128      Chief Complaint  Patient presents with  . Dental Pain    (Consider location/radiation/quality/duration/timing/severity/associated sxs/prior treatment) HPI Comments: Pt is currently pregnant, reports her oral surgeon needed to wait until her pregnancy was further along to do xrays, then to plan dental extraction.  She reports her dentist thought most of her upper left teeth needed to be extracted.  Reports has plans to have tooth or teeth extracted by oral surgeon, but not for weeks.  No complications from pregnancy.  Patient is a 27 y.o. female presenting with tooth pain. The history is provided by the patient.  Dental Pain Location:  Upper Upper teeth location:  15/LU 2nd molar Quality:  Dull, shooting, pulsating and aching Severity:  Severe Onset quality:  Gradual Duration:  2 months Timing:  Sporadic Progression:  Worsening Chronicity:  Recurrent Context: poor dentition   Context: not abscess, not dental fracture, not enamel fracture, filling still in place, not recent dental surgery and not trauma   Previous work-up:  Dental exam Relieved by:  Nothing Ineffective treatments:  Acetaminophen Associated symptoms: no neck pain   Risk factors: smoking     Past Medical History  Diagnosis Date  . No pertinent past medical history     Past Surgical History  Procedure Laterality Date  . Ovarian cyst surgery      Family History  Problem Relation Age of Onset  . Hypertension Mother   . Cancer Father   . Heart disease Maternal Grandmother     History  Substance Use Topics  . Smoking status: Former Smoker -- 5 years    Types: Cigarettes    Quit date: 07/19/2005  . Smokeless tobacco: Not on file  . Alcohol Use: Yes     Comment: socially when not pregnant    OB History   Grav Para Term Preterm Abortions TAB SAB Ect Mult Living   2 1 1        1       Review of Systems  Constitutional: Positive for chills. Negative for appetite change.  HENT: Positive for dental problem. Negative for trouble swallowing, neck pain and voice change.   Respiratory: Negative for shortness of breath.   Cardiovascular: Negative for chest pain.  Gastrointestinal: Negative for nausea, vomiting and abdominal pain.    Allergies  Review of patient's allergies indicates no known allergies.  Home Medications   Current Outpatient Rx  Name  Route  Sig  Dispense  Refill  . acetaminophen (TYLENOL) 325 MG tablet   Oral   Take 650 mg by mouth every 6 (six) hours as needed for pain.         . Prenatal Vit-Fe Fumarate-FA (PRENATAL MULTIVITAMIN) TABS   Oral   Take 1 tablet by mouth daily at 12 noon.         Marland Kitchen HYDROcodone-acetaminophen (NORCO/VICODIN) 5-325 MG per tablet      1-2 tablets po q 6 hours prn moderate to severe pain   20 tablet   0   . penicillin v potassium (VEETID) 500 MG tablet   Oral   Take 1 tablet (500 mg total) by mouth 3 (three) times daily.   30 tablet   0     BP 123/66  Temp(Src) 97.9 F (36.6 C) (Oral)  Resp 18  SpO2 97%  LMP 09/21/2012  Physical Exam  Nursing note and vitals reviewed. Constitutional: She appears well-developed and well-nourished. No distress.  HENT:  Head: Normocephalic and atraumatic.  Mouth/Throat: Uvula is midline, oropharynx is clear and moist and mucous membranes are normal.    No visible oral ulcerations, rash, erythema, pus, fluctuance.   Eyes: EOM are normal.  Neck: Neck supple.  Pulmonary/Chest: Effort normal. No respiratory distress.  Abdominal: Soft. She exhibits no distension.  gravid  Neurological: She is alert.  Skin: Skin is warm and dry. No rash noted. No pallor.    ED Course  Procedures (including critical care time)  Labs Reviewed - No data to display No results found.   1. Pain, dental       MDM  Will give analgesics and PCN.  Pt agreeable to take  narcotics despite category C medication.  Pt instructed to follow up with oral surgeon earlier.          Gavin Pound. Oletta Lamas, MD 03/05/13 4098

## 2013-04-12 ENCOUNTER — Inpatient Hospital Stay (HOSPITAL_COMMUNITY)
Admission: AD | Admit: 2013-04-12 | Discharge: 2013-04-12 | Disposition: A | Discharge status: Home / Self Care | Payer: Medicaid Other | Source: Ambulatory Visit | Attending: Obstetrics and Gynecology | Admitting: Obstetrics and Gynecology

## 2013-04-12 ENCOUNTER — Inpatient Hospital Stay (HOSPITAL_COMMUNITY): Payer: Medicaid Other

## 2013-04-12 ENCOUNTER — Encounter (HOSPITAL_COMMUNITY): Payer: Self-pay | Admitting: *Deleted

## 2013-04-12 DIAGNOSIS — N83209 Unspecified ovarian cyst, unspecified side: Secondary | ICD-10-CM | POA: Insufficient documentation

## 2013-04-12 DIAGNOSIS — O368131 Decreased fetal movements, third trimester, fetus 1: Secondary | ICD-10-CM

## 2013-04-12 DIAGNOSIS — O34599 Maternal care for other abnormalities of gravid uterus, unspecified trimester: Secondary | ICD-10-CM | POA: Insufficient documentation

## 2013-04-12 DIAGNOSIS — O368190 Decreased fetal movements, unspecified trimester, not applicable or unspecified: Secondary | ICD-10-CM | POA: Insufficient documentation

## 2013-04-12 HISTORY — DX: Anemia, unspecified: D64.9

## 2013-04-12 HISTORY — DX: Unspecified ovarian cyst, unspecified side: N83.209

## 2013-04-12 NOTE — MAU Note (Signed)
Fetal heart tones in triage from 111-128 with audible movement.

## 2013-04-12 NOTE — MAU Provider Note (Signed)
History   27yo G2P1001 at [redacted]w[redacted]d presented to MAU after calling the office with decreased FM since Sunday.  Denies VB, UCs, LOF, recent fever, resp or GI c/o's, UTI s/s .   Chief Complaint  Patient presents with  . Decreased Fetal Movement    OB History   Grav Para Term Preterm Abortions TAB SAB Ect Mult Living   2 1 1       1       Past Medical History  Diagnosis Date  . No pertinent past medical history   . Ovarian cyst   . Anemia     Past Surgical History  Procedure Laterality Date  . Ovarian cyst surgery      Family History  Problem Relation Age of Onset  . Hypertension Mother   . Cancer Father   . Heart disease Maternal Grandmother     History  Substance Use Topics  . Smoking status: Former Smoker -- 5 years    Types: Cigarettes    Quit date: 07/19/2005  . Smokeless tobacco: Not on file  . Alcohol Use: Yes     Comment: socially when not pregnant    Allergies: No Known Allergies  Prescriptions prior to admission  Medication Sig Dispense Refill  . acetaminophen (TYLENOL) 325 MG tablet Take 650 mg by mouth every 6 (six) hours as needed for pain.      Marland Kitchen HYDROcodone-acetaminophen (NORCO/VICODIN) 5-325 MG per tablet 1-2 tablets po q 6 hours prn moderate to severe pain  20 tablet  0  . penicillin v potassium (VEETID) 500 MG tablet Take 1 tablet (500 mg total) by mouth 3 (three) times daily.  30 tablet  0  . Prenatal Vit-Fe Fumarate-FA (PRENATAL MULTIVITAMIN) TABS Take 1 tablet by mouth daily at 12 noon.        ROS: see HPI above, all other systems are negative   Physical Exam   Blood pressure 114/56, pulse 84, temperature 98 F (36.7 C), temperature source Oral, resp. rate 16, height 5' 3.5" (1.613 m), weight 146 lb 9.6 oz (66.497 kg), last menstrual period 09/21/2012, SpO2 100.00%.  Chest: Clear Heart: RRR Abdomen: gravid, NT Extremities: WNL  FHT: Reactive NST UCs: none  BPP: 8/8 AFI 86th%ile  ED Course  IUP at [redacted]w[redacted]d Decreased  FM  NST BPP  Discharge home with [facility] instructions F/u at already scheduled ROB on 7/29     Haroldine Laws CNM, MSN 04/12/2013 11:37 AM

## 2013-04-12 NOTE — MAU Note (Signed)
Patient states she has not felt fetal movement since 7-20. States she has a right ovarian cyst with slight pain. Denies bleeding or leaking.

## 2013-06-16 ENCOUNTER — Encounter (HOSPITAL_COMMUNITY): Payer: Self-pay | Admitting: *Deleted

## 2013-06-16 ENCOUNTER — Inpatient Hospital Stay (HOSPITAL_COMMUNITY)
Admission: AD | Admit: 2013-06-16 | Discharge: 2013-06-16 | Disposition: A | Discharge status: Home / Self Care | Payer: Medicaid Other | Source: Ambulatory Visit | Attending: Obstetrics and Gynecology | Admitting: Obstetrics and Gynecology

## 2013-06-16 DIAGNOSIS — M545 Low back pain, unspecified: Secondary | ICD-10-CM | POA: Insufficient documentation

## 2013-06-16 DIAGNOSIS — O479 False labor, unspecified: Secondary | ICD-10-CM | POA: Insufficient documentation

## 2013-06-16 NOTE — MAU Note (Signed)
PT SAYS SHE HURT BAD  TODAY AT 3-4PM- - JUST GOT OFF WORK.   VE IN OFFICE  ON Monday- 3 CM. DENIES HSV AND MRSA.

## 2013-06-16 NOTE — MAU Provider Note (Signed)
  History     CSN: [id number]  Arrival date and time: 06/16/13 2129   None     Chief Complaint  Patient presents with  . Labor Eval   HPI    Past Medical History  Diagnosis Date  . No pertinent past medical history   . Ovarian cyst   . Anemia     Past Surgical History  Procedure Laterality Date  . Ovarian cyst surgery      Family History  Problem Relation Age of Onset  . Hypertension Mother   . Cancer Father   . Heart disease Maternal Grandmother     History  Substance Use Topics  . Smoking status: Former Smoker -- 5 years    Types: Cigarettes    Quit date: 07/19/2005  . Smokeless tobacco: Not on file  . Alcohol Use: No     Comment: socially when not pregnant    Allergies: No Known Allergies  Prescriptions prior to admission  Medication Sig Dispense Refill  . IRON PO Take 1 tablet by mouth at bedtime.       . Prenatal Vit-Fe Fumarate-FA (PRENATAL MULTIVITAMIN) TABS Take 1 tablet by mouth at bedtime.         Review of Systems  All other systems reviewed and are negative.   Physical Exam   Blood pressure 121/75, pulse 97, temperature 98.1 F (36.7 C), temperature source Oral, resp. rate 20, height 5\' 3"  (1.6 m), weight 160 lb 4 oz (72.689 kg), last menstrual period 09/21/2012.  Physical Exam  Nursing note and vitals reviewed. Constitutional: She is oriented to person, place, and time. She appears well-developed and well-nourished.  HENT:  Head: Normocephalic.  Eyes: Pupils are equal, round, and reactive to light.  Neck: Normal range of motion.  Cardiovascular: Normal rate, regular rhythm and normal heart sounds.   Respiratory: Effort normal and breath sounds normal.  GI: Soft. Bowel sounds are normal.  Genitourinary: Vagina normal.  Musculoskeletal: Normal range of motion.  Neurological: She is alert and oriented to person, place, and time. She has normal reflexes.  Skin: Skin is warm and dry.  Psychiatric: She has a normal mood and affect.  Her behavior is normal.    MAU Course  Procedures    Assessment and Plan  IUP at [redacted]w[redacted]d FHR cat 1 irreg ctx Cervix unchanged from office 3/50 Pt declines pain meds  Dc home stable condition, rv'd FKC and labor sx's, f/u office Monday as scheduled   Gissell Barra M 06/16/2013, 10:51 PM

## 2013-06-17 ENCOUNTER — Inpatient Hospital Stay (HOSPITAL_COMMUNITY)
Admission: AD | Admit: 2013-06-17 | Discharge: 2013-06-19 | DRG: 775 | Disposition: A | Discharge status: Home / Self Care | Payer: Medicaid Other | Source: Ambulatory Visit | Attending: Obstetrics and Gynecology | Admitting: Obstetrics and Gynecology

## 2013-06-17 ENCOUNTER — Encounter (HOSPITAL_COMMUNITY): Payer: Self-pay | Admitting: *Deleted

## 2013-06-17 ENCOUNTER — Encounter (HOSPITAL_COMMUNITY): Payer: Self-pay | Admitting: Anesthesiology

## 2013-06-17 DIAGNOSIS — O9903 Anemia complicating the puerperium: Secondary | ICD-10-CM | POA: Diagnosis not present

## 2013-06-17 DIAGNOSIS — N760 Acute vaginitis: Secondary | ICD-10-CM | POA: Insufficient documentation

## 2013-06-17 DIAGNOSIS — D649 Anemia, unspecified: Secondary | ICD-10-CM | POA: Diagnosis present

## 2013-06-17 DIAGNOSIS — O26849 Uterine size-date discrepancy, unspecified trimester: Secondary | ICD-10-CM | POA: Insufficient documentation

## 2013-06-17 DIAGNOSIS — O409XX0 Polyhydramnios, unspecified trimester, not applicable or unspecified: Secondary | ICD-10-CM | POA: Diagnosis present

## 2013-06-17 LAB — CBC
MCH: 30.4 pg (ref 26.0–34.0)
Platelets: 142 10*3/uL — ABNORMAL LOW (ref 150–400)
RBC: 3.82 MIL/uL — ABNORMAL LOW (ref 3.87–5.11)
RDW: 12.7 % (ref 11.5–15.5)
WBC: 14.1 10*3/uL — ABNORMAL HIGH (ref 4.0–10.5)

## 2013-06-17 LAB — OB RESULTS CONSOLE GBS: GBS: NEGATIVE

## 2013-06-17 MED ORDER — LACTATED RINGERS IV SOLN: 500.0000 mL | Freq: Once | INTRAVENOUS | Status: DC

## 2013-06-17 MED ORDER — OXYTOCIN BOLUS FROM INFUSION
500.0000 mL | INTRAVENOUS | Status: DC
  Administered 2013-06-17: 500 mL via INTRAVENOUS

## 2013-06-17 MED ORDER — LIDOCAINE HCL (PF) 1 % IJ SOLN
30.0000 mL | INTRAMUSCULAR | Status: DC | PRN
  Administered 2013-06-17: 30 mL via SUBCUTANEOUS
  Filled 2013-06-17 (×2): qty 30

## 2013-06-17 MED ORDER — PHENYLEPHRINE 40 MCG/ML (10ML) SYRINGE FOR IV PUSH (FOR BLOOD PRESSURE SUPPORT)
80.0000 ug | PREFILLED_SYRINGE | INTRAVENOUS | Status: DC | PRN
  Filled 2013-06-17: qty 2

## 2013-06-17 MED ORDER — MISOPROSTOL 200 MCG PO TABS
ORAL_TABLET | ORAL | Status: AC
  Administered 2013-06-17: 1000 ug
  Filled 2013-06-17: qty 5

## 2013-06-17 MED ORDER — PHENYLEPHRINE 40 MCG/ML (10ML) SYRINGE FOR IV PUSH (FOR BLOOD PRESSURE SUPPORT)
80.0000 ug | PREFILLED_SYRINGE | INTRAVENOUS | Status: DC | PRN
  Filled 2013-06-17: qty 5
  Filled 2013-06-17: qty 2

## 2013-06-17 MED ORDER — LACTATED RINGERS IV SOLN
INTRAVENOUS | Status: DC
  Administered 2013-06-17: 21:00:00 via INTRAVENOUS

## 2013-06-17 MED ORDER — LACTATED RINGERS IV SOLN: 500.0000 mL | INTRAVENOUS | Status: DC | PRN

## 2013-06-17 MED ORDER — FENTANYL 2.5 MCG/ML BUPIVACAINE 1/10 % EPIDURAL INFUSION (WH - ANES)
14.0000 mL/h | INTRAMUSCULAR | Status: DC | PRN
  Filled 2013-06-17: qty 125

## 2013-06-17 MED ORDER — CITRIC ACID-SODIUM CITRATE 334-500 MG/5ML PO SOLN: 30.0000 mL | ORAL | Status: DC | PRN

## 2013-06-17 MED ORDER — ONDANSETRON HCL 4 MG/2ML IJ SOLN: 4.0000 mg | Freq: Four times a day (QID) | INTRAMUSCULAR | Status: DC | PRN

## 2013-06-17 MED ORDER — NALBUPHINE SYRINGE 5 MG/0.5 ML: 10.0000 mg | INJECTION | Freq: Four times a day (QID) | INTRAMUSCULAR | Status: DC | PRN

## 2013-06-17 MED ORDER — NALBUPHINE HCL 10 MG/ML IJ SOLN
10.0000 mg | Freq: Four times a day (QID) | INTRAMUSCULAR | Status: DC | PRN
  Administered 2013-06-17: 10 mg via INTRAVENOUS

## 2013-06-17 MED ORDER — ACETAMINOPHEN 325 MG PO TABS: 650.0000 mg | ORAL_TABLET | ORAL | Status: DC | PRN

## 2013-06-17 MED ORDER — EPHEDRINE 5 MG/ML INJ
10.0000 mg | INTRAVENOUS | Status: DC | PRN
  Filled 2013-06-17: qty 2

## 2013-06-17 MED ORDER — IBUPROFEN 600 MG PO TABS: 600.0000 mg | ORAL_TABLET | Freq: Four times a day (QID) | ORAL | Status: DC | PRN

## 2013-06-17 MED ORDER — NALBUPHINE SYRINGE 5 MG/0.5 ML
10.0000 mg | INJECTION | Freq: Four times a day (QID) | INTRAMUSCULAR | Status: DC | PRN
Start: 2013-06-17 — End: 2013-06-18
  Filled 2013-06-17: qty 1

## 2013-06-17 MED ORDER — EPHEDRINE 5 MG/ML INJ
10.0000 mg | INTRAVENOUS | Status: DC | PRN
  Filled 2013-06-17: qty 2
  Filled 2013-06-17: qty 4

## 2013-06-17 MED ORDER — OXYCODONE-ACETAMINOPHEN 5-325 MG PO TABS
1.0000 | ORAL_TABLET | ORAL | Status: DC | PRN
  Administered 2013-06-17: 2 via ORAL
  Filled 2013-06-17: qty 2

## 2013-06-17 MED ORDER — OXYTOCIN 40 UNITS IN LACTATED RINGERS INFUSION - SIMPLE MED
62.5000 mL/h | INTRAVENOUS | Status: DC
  Administered 2013-06-17: 62.5 mL/h via INTRAVENOUS
  Filled 2013-06-17: qty 1000

## 2013-06-17 MED ORDER — DIPHENHYDRAMINE HCL 50 MG/ML IJ SOLN: 12.5000 mg | INTRAMUSCULAR | Status: DC | PRN

## 2013-06-17 MED ORDER — NALBUPHINE SYRINGE 5 MG/0.5 ML
INJECTION | INTRAMUSCULAR | Status: AC
  Filled 2013-06-17: qty 1

## 2013-06-17 NOTE — MAU Note (Signed)
Pt reports her water broke about an hour ago. Having some bleeding as well and reports good fetal movment. C/o contractions q 3-4 min.

## 2013-06-17 NOTE — H&P (Signed)
Janice Carney is a 27 y.o. female, G2P1001 at [redacted]w[redacted]d, presenting for SROM and UCs since 23.  Clear fluid. Denies VB, recent fever, resp or GI c/o's, UTI or PIH s/s. GFM. Desires epidural.  Patient Active Problem List   Diagnosis Date Noted  . Vaginitis and vulvovaginitis 06/17/2013  . Uterine size date discrepancy 06/17/2013  . Polyhydramnios 06/17/2013  . ADNEXAL MASS 06/19/2010  . VIRAL INFECTION 06/04/2010  . SYNDROME, PELVIC CONGESTION 04/22/2010    History of present pregnancy: Patient entered care at 17 weeks.   EDC of 06/28/13 was established by LMP.   Anatomy scan:  21 weeks, with normal findings and an anterior placenta.  RT adnexa/RTOV - a simple cystic structure seen adjacent to RTOV Additional [place] evaluations:  30 weeks for S>D - AFI 90th%ile, EFW 61st%ile.  36 weeks for S>D and poly - AFI 85th%ile, EFW 64th%ile. Significant prenatal events:  none   Last evaluation:  06/13/13 at [redacted]w[redacted]d    3cm / 60% / -2  OB History   Grav Para Term Preterm Abortions TAB SAB Ect Mult Living   2 1 1       1      Past Medical History  Diagnosis Date  . No pertinent past medical history   . Ovarian cyst   . Anemia    Past Surgical History  Procedure Laterality Date  . Ovarian cyst surgery     Family History: family history includes Cancer in her father; Heart disease in her maternal grandmother; Hypertension in her mother. Social History:  reports that she quit smoking about 7 years ago. Her smoking use included Cigarettes. She smoked 0.00 packs per day for 5 years. She does not have any smokeless tobacco history on file. She reports that she does not drink alcohol or use illicit drugs.   Prenatal Transfer Tool  Maternal Diabetes: No Genetic Screening: Normal Maternal Ultrasounds/Referrals: Abnormal:  Findings:   Other:Polyhydramnios Fetal Ultrasounds or other Referrals:  None Maternal Substance Abuse:  No Significant Maternal Medications:  None Significant Maternal Lab Results:  Lab values include: Group B Strep negative    ROS: see HPI above, all other systems are negative  No Known Allergies   Dilation: 6 Effacement (%): 80 Station: +1 Last menstrual period 09/21/2012.  Chest clear Heart RRR without murmur Abd gravid, NT Ext: WNL  FHR: Cat II UCs:  Q 2-5 min  Prenatal labs: ABO, Rh: A/Positive/-- (04/22 0000) Antibody: Negative (04/22 0000) Rubella:   Immune RPR: Nonreactive (04/22 0000)  HBsAg: Negative (04/22 0000)  HIV: Non-reactive (04/22 0000)  GBS:  Neg Sickle cell/Hgb electrophoresis:  n/a Pap:  01/25/13 WNL GC:  Neg Chlamydia:  Neg Genetic screenings:  Quad neg Glucola:  97 Other:  HSV 1&2 - neg/neg  Assessment/Plan: IUP at [redacted]w[redacted]d Active labor and SROM GBS neg Polyhydramnios Desires epidural  Admit to [facility] per c/w Dr. Stefano Gaul as attending MD Routine [facility] orders Anticipate epidural as pain progresses    Rowan Blase, MSN 06/17/2013, 9:13 PM

## 2013-06-18 LAB — ABO/RH: ABO/RH(D): A POS

## 2013-06-18 LAB — RPR: RPR Ser Ql: NONREACTIVE

## 2013-06-18 LAB — CBC
Hemoglobin: 10.3 g/dL — ABNORMAL LOW (ref 12.0–15.0)
MCH: 30.2 pg (ref 26.0–34.0)
RBC: 3.41 MIL/uL — ABNORMAL LOW (ref 3.87–5.11)
RDW: 12.8 % (ref 11.5–15.5)

## 2013-06-18 LAB — TYPE AND SCREEN: Antibody Screen: NEGATIVE

## 2013-06-18 MED ORDER — FERROUS SULFATE 325 (65 FE) MG PO TABS
325.0000 mg | ORAL_TABLET | Freq: Two times a day (BID) | ORAL | Status: DC
  Administered 2013-06-18 – 2013-06-19 (×3): 325 mg via ORAL
  Filled 2013-06-18 (×3): qty 1

## 2013-06-18 MED ORDER — IBUPROFEN 600 MG PO TABS
600.0000 mg | ORAL_TABLET | Freq: Four times a day (QID) | ORAL | Status: DC
  Administered 2013-06-18 – 2013-06-19 (×5): 600 mg via ORAL
  Filled 2013-06-18 (×5): qty 1

## 2013-06-18 MED ORDER — SIMETHICONE 80 MG PO CHEW: 80.0000 mg | CHEWABLE_TABLET | ORAL | Status: DC | PRN

## 2013-06-18 MED ORDER — ONDANSETRON HCL 4 MG/2ML IJ SOLN: 4.0000 mg | INTRAMUSCULAR | Status: DC | PRN

## 2013-06-18 MED ORDER — WITCH HAZEL-GLYCERIN EX PADS
1.0000 "application " | MEDICATED_PAD | CUTANEOUS | Status: DC | PRN
  Administered 2013-06-18: 1 via TOPICAL

## 2013-06-18 MED ORDER — LANOLIN HYDROUS EX OINT: TOPICAL_OINTMENT | CUTANEOUS | Status: DC | PRN

## 2013-06-18 MED ORDER — SENNOSIDES-DOCUSATE SODIUM 8.6-50 MG PO TABS
2.0000 | ORAL_TABLET | ORAL | Status: DC
  Administered 2013-06-18: 2 via ORAL

## 2013-06-18 MED ORDER — OXYCODONE-ACETAMINOPHEN 5-325 MG PO TABS
1.0000 | ORAL_TABLET | ORAL | Status: DC | PRN
  Administered 2013-06-18 (×2): 1 via ORAL
  Filled 2013-06-18 (×2): qty 1

## 2013-06-18 MED ORDER — ZOLPIDEM TARTRATE 5 MG PO TABS: 5.0000 mg | ORAL_TABLET | Freq: Every evening | ORAL | Status: DC | PRN

## 2013-06-18 MED ORDER — ONDANSETRON HCL 4 MG PO TABS: 4.0000 mg | ORAL_TABLET | ORAL | Status: DC | PRN

## 2013-06-18 MED ORDER — PRENATAL MULTIVITAMIN CH
1.0000 | ORAL_TABLET | Freq: Every day | ORAL | Status: DC
  Administered 2013-06-18: 1 via ORAL
  Filled 2013-06-18: qty 1

## 2013-06-18 MED ORDER — DIBUCAINE 1 % RE OINT: 1.0000 "application " | TOPICAL_OINTMENT | RECTAL | Status: DC | PRN

## 2013-06-18 MED ORDER — BENZOCAINE-MENTHOL 20-0.5 % EX AERO
1.0000 "application " | INHALATION_SPRAY | CUTANEOUS | Status: DC | PRN
  Administered 2013-06-18: 1 via TOPICAL
  Filled 2013-06-18 (×2): qty 56

## 2013-06-18 MED ORDER — TETANUS-DIPHTH-ACELL PERTUSSIS 5-2.5-18.5 LF-MCG/0.5 IM SUSP
0.5000 mL | Freq: Once | INTRAMUSCULAR | Status: AC
  Administered 2013-06-18: 0.5 mL via INTRAMUSCULAR

## 2013-06-18 MED ORDER — DIPHENHYDRAMINE HCL 25 MG PO CAPS: 25.0000 mg | ORAL_CAPSULE | Freq: Four times a day (QID) | ORAL | Status: DC | PRN

## 2013-06-18 NOTE — Progress Notes (Signed)
Pt ambulates to br, voids, pericare done and taught.  Gown changed. Ambulates to wc

## 2013-06-18 NOTE — Progress Notes (Signed)
Post Partum Day 1  Subjective: no complaints, voiding and tolerating PO  Objective: Blood pressure 108/61, pulse 87, temperature 97.7 F (36.5 C), temperature source Oral, resp. rate 18, height 5\' 3"  (1.6 m), weight 160 lb (72.576 kg), last menstrual period 09/21/2012, SpO2 97.00%, unknown if currently breastfeeding.  Physical Exam:  General: no distress Lochia: appropriate Uterine Fundus: firm Incision: NA DVT Evaluation: No evidence of DVT seen on physical exam.   Recent Labs  06/17/13 2120 06/18/13 0632  HGB 11.6* 10.3*  HCT 32.7* 29.5*    Assessment/Plan: Plan for discharge tomorrow, Breastfeeding and Contraception undecided Outpatient circ for son Jean Rosenthal)   LOS: 1 day   Jaeshawn Silvio V 06/18/2013, 1:25 PM

## 2013-06-18 NOTE — Progress Notes (Signed)
Called Dr. Renold Don to report pt wanting to push

## 2013-06-19 DIAGNOSIS — D649 Anemia, unspecified: Secondary | ICD-10-CM | POA: Diagnosis present

## 2013-06-19 MED ORDER — FERROUS SULFATE 325 (65 FE) MG PO TABS: 325.0000 mg | ORAL_TABLET | Freq: Two times a day (BID) | ORAL | Status: DC

## 2013-06-19 MED ORDER — OXYCODONE-ACETAMINOPHEN 5-325 MG PO TABS: 1.0000 | ORAL_TABLET | ORAL | Status: DC | PRN

## 2013-06-19 MED ORDER — IBUPROFEN 600 MG PO TABS: 600.0000 mg | ORAL_TABLET | Freq: Four times a day (QID) | ORAL | Status: DC

## 2013-06-19 NOTE — Discharge Summary (Signed)
Vaginal Delivery Discharge Summary  Janice Carney  DOB:    20-Nov-1985 MRN:    [id number] CSN:    [id number]  Date of admission:                  06/17/13  Date of discharge:                   06/19/13  Procedures this admission: NSVD with 2nd degree lac vaginal, and labial  Date of Delivery: 06/17/13 by Haroldine Laws  Newborn Data:  Live born female  Birth Weight: 7 lb 3 oz (3260 g) APGAR: 8, 9  Home with mother. Circumcision Plan: Outpatient  History of Present Illness:  Janice Carney is a 27 y.o. female, G2P2002, who presents at [redacted]w[redacted]d weeks gestation. The patient has been followed at the [facility] division of [facility]. She was admitted onset of labor. Her pregnancy has been complicated by:   Patient Active Problem List   Diagnosis Date Noted  . NSVD (normal spontaneous vaginal delivery) 06/19/2013  . Second-degree perineal laceration, with delivery 06/19/2013  . Anemia 06/19/2013  . Vaginitis and vulvovaginitis 06/17/2013  . Uterine size date discrepancy 06/17/2013  . Polyhydramnios 06/17/2013  . ADNEXAL MASS 06/19/2010  . VIRAL INFECTION 06/04/2010  . SYNDROME, PELVIC CONGESTION 04/22/2010    Hospital course:  The patient was admitted for active labor.   Her labor was not complicated but rapid. She proceeded to have a vaginal delivery of a healthy infant. Her delivery was not complicated. Her postpartum course was not complicated. Hgb 10.3. She was discharged to home on postpartum day 2 doing well.  Feeding:  breast  Contraception:  unknown at this time, will discuss at 5 week PP appointment.  Discharge hemoglobin:  Hemoglobin  Date Value Range Status  06/18/2013 10.3* 12.0 - 15.0 g/dL Final     HCT  Date Value Range Status  06/18/2013 29.5* 36.0 - 46.0 % Final    Discharge Physical Exam:   General: alert, cooperative and no distress Lochia: appropriate Uterine Fundus: firm Incision:  healing well DVT Evaluation: No evidence of DVT seen on physical exam. Negative Homan's sign.  Intrapartum Procedures: spontaneous vaginal delivery and cytotec given as bleeding precaution Postpartum Procedures: none Complications-Operative and Postpartum: 2nd degree perineal laceration and labial laceration  Discharge Diagnoses: Term Pregnancy-delivered and anemia  Discharge Information:  Activity:           pelvic rest Diet:                routine Medications: PNV, Ibuprofen, Iron and Percocet Condition:      stable Instructions:   Postpartum Care After Vaginal Delivery  After you deliver your newborn (postpartum period), the usual stay in the hospital is 24 72 hours. If there were problems with your labor or delivery, or if you have other medical problems, you might be in the hospital longer.  While you are in the hospital, you will receive help and instructions on how to care for yourself and your newborn during the postpartum period.  While you are in the hospital:  Be sure to tell your nurses if you have pain or discomfort, as well as where you feel the pain and what makes the pain worse.  If you had an incision made near your vagina (episiotomy) or if you had some tearing during delivery, the nurses may put ice packs on your episiotomy or tear. The ice packs may  help to reduce the pain and swelling.  If you are breastfeeding, you may feel uncomfortable contractions of your uterus for a couple of weeks. This is normal. The contractions help your uterus get back to normal size.  It is normal to have some bleeding after delivery.  For the first 1 3 days after delivery, the flow is red and the amount may be similar to a period.  It is common for the flow to start and stop.  In the first few days, you may pass some small clots. Let your nurses know if you begin to pass large clots or your flow increases.  Do not  flush blood clots down the toilet before having the nurse look at  them.  During the next 3 10 days after delivery, your flow should become more watery and pink or brown-tinged in color.  Ten to fourteen days after delivery, your flow should be a small amount of yellowish-white discharge.  The amount of your flow will decrease over the first few weeks after delivery. Your flow may stop in 6 8 weeks. Most women have had their flow stop by 12 weeks after delivery.  You should change your sanitary pads frequently.  Wash your hands thoroughly with soap and water for at least 20 seconds after changing pads, using the toilet, or before holding or feeding your newborn.  You should feel like you need to empty your bladder within the first 6 8 hours after delivery.  In case you become weak, lightheaded, or faint, call your nurse before you get out of bed for the first time and before you take a shower for the first time.  Within the first few days after delivery, your breasts may begin to feel tender and full. This is called engorgement. Breast tenderness usually goes away within 48 72 hours after engorgement occurs. You may also notice milk leaking from your breasts. If you are not breastfeeding, do not stimulate your breasts. Breast stimulation can make your breasts produce more milk.  Spending as much time as possible with your newborn is very important. During this time, you and your newborn can feel close and get to know each other. Having your newborn stay in your room (rooming in) will help to strengthen the bond with your newborn. It will give you time to get to know your newborn and become comfortable caring for your newborn.  Your hormones change after delivery. Sometimes the hormone changes can temporarily cause you to feel sad or tearful. These feelings should not last more than a few days. If these feelings last longer than that, you should talk to your caregiver.  If desired, talk to your caregiver about methods of family planning or  contraception.  Talk to your caregiver about immunizations. Your caregiver may want you to have the following immunizations before leaving the hospital:  Tetanus, diphtheria, and pertussis (Tdap) or tetanus and diphtheria (Td) immunization. It is very important that you and your family (including grandparents) or others caring for your newborn are up-to-date with the Tdap or Td immunizations. The Tdap or Td immunization can help protect your newborn from getting ill.  Rubella immunization.  Varicella (chickenpox) immunization.  Influenza immunization. You should receive this annual immunization if you did not receive the immunization during your pregnancy. Document Released: 07/06/2007 Document Revised: 06/02/2012 Document Reviewed: 05/05/2012 [facility] Patient Information 2014 [place], [place].   Postpartum Depression and Baby Blues  The postpartum period begins right after the birth of a baby. During  this time, there is often a great amount of joy and excitement. It is also a time of considerable changes in the life of the parent(s). Regardless of how many times a mother gives birth, each child brings new challenges and dynamics to the family. It is not unusual to have feelings of excitement accompanied by confusing shifts in moods, emotions, and thoughts. All mothers are at risk of developing postpartum depression or the "baby blues." These mood changes can occur right after giving birth, or they may occur many months after giving birth. The baby blues or postpartum depression can be mild or severe. Additionally, postpartum depression can resolve rather quickly, or it can be a long-term condition. CAUSES Elevated hormones and their rapid decline are thought to be a main cause of postpartum depression and the baby blues. There are a number of hormones that radically change during and after pregnancy. Estrogen and progesterone usually decrease immediately after delivering your baby. The level of  thyroid hormone and various cortisol steroids also rapidly drop. Other factors that play a major role in these changes include major life events and genetics.  RISK FACTORS If you have any of the following risks for the baby blues or postpartum depression, know what symptoms to watch out for during the postpartum period. Risk factors that may increase the likelihood of getting the baby blues or postpartum depression include:  Havinga personal or family history of depression.  Having depression while being pregnant.  Having premenstrual or oral contraceptive-associated mood issues.  Having exceptional life stress.  Having marital conflict.  Lacking a social support network.  Having a baby with special needs.  Having health problems such as diabetes. SYMPTOMS Baby blues symptoms include:  Brief fluctuations in mood, such as going from extreme happiness to sadness.  Decreased concentration.  Difficulty sleeping.  Crying spells, tearfulness.  Irritability.  Anxiety. Postpartum depression symptoms typically begin within the first month after giving birth. These symptoms include:  Difficulty sleeping or excessive sleepiness.  Marked weight loss.  Agitation.  Feelings of worthlessness.  Lack of interest in activity or food. Postpartum psychosis is a very concerning condition and can be dangerous. Fortunately, it is rare. Displaying any of the following symptoms is cause for immediate medical attention. Postpartum psychosis symptoms include:  Hallucinations and delusions.  Bizarre or disorganized behavior.  Confusion or disorientation. DIAGNOSIS  A diagnosis is made by an evaluation of your symptoms. There are no medical or lab tests that lead to a diagnosis, but there are various questionnaires that a caregiver may use to identify those with the baby blues, postpartum depression, or psychosis. Often times, a screening tool called the [place] Postnatal Depression Scale  is used to diagnose depression in the postpartum period.  TREATMENT The baby blues usually goes away on its own in 1 to 2 weeks. Social support is often all that is needed. You should be encouraged to get adequate sleep and rest. Occasionally, you may be given medicines to help you sleep.  Postpartum depression requires treatment as it can last several months or longer if it is not treated. Treatment may include individual or group therapy, medicine, or both to address any social, physiological, and psychological factors that may play a role in the depression. Regular exercise, a healthy diet, rest, and social support may also be strongly recommended.  Postpartum psychosis is more serious and needs treatment right away. Hospitalization is often needed. HOME CARE INSTRUCTIONS  Get as much rest as you can. Nap when the  baby sleeps.  Exercise regularly. Some women find yoga and walking to be beneficial.  Eat a balanced and nourishing diet.  Do little things that you enjoy. Have a cup of tea, take a bubble bath, read your favorite magazine, or listen to your favorite music.  Avoid alcohol.  Ask for help with household chores, cooking, grocery shopping, or running errands as needed. Do not try to do everything.  Talk to people close to you about how you are feeling. Get support from your partner, family members, friends, or other new moms.  Try to stay positive in how you think. Think about the things you are grateful for.  Do not spend a lot of time alone.  Only take medicine as directed by your caregiver.  Keep all your postpartum appointments.  Let your caregiver know if you have any concerns. SEEK MEDICAL CARE IF: You are having a reaction or problems with your medicine. SEEK IMMEDIATE MEDICAL CARE IF:  You have suicidal feelings.  You feel you may harm the baby or someone else. Document Released: 06/12/2004 Document Revised: 12/01/2011 Document Reviewed: 07/15/2011 [facility]  Patient Information 2014 [place], [place].   Discharge to: home  Follow-up Information   Follow up with [facility]. Schedule an appointment as soon as possible for a visit in 5 weeks. (Call with any questions or concerns.)    Specialty:  Obstetrics and Gynecology   Contact information:   [address] [place] [address] [phone]       Haroldine Laws 06/19/2013

## 2013-06-20 NOTE — Progress Notes (Signed)
Post discharge review completed. 

## 2014-07-24 ENCOUNTER — Encounter (HOSPITAL_COMMUNITY): Payer: Self-pay | Admitting: *Deleted

## 2014-09-17 ENCOUNTER — Encounter (HOSPITAL_COMMUNITY): Payer: Self-pay

## 2014-09-17 ENCOUNTER — Emergency Department (HOSPITAL_COMMUNITY)
Admission: EM | Admit: 2014-09-17 | Discharge: 2014-09-17 | Disposition: A | Discharge status: Home / Self Care | Payer: Medicaid Other | Attending: Emergency Medicine | Admitting: Emergency Medicine

## 2014-09-17 ENCOUNTER — Emergency Department (HOSPITAL_COMMUNITY): Payer: Medicaid Other

## 2014-09-17 DIAGNOSIS — Z8742 Personal history of other diseases of the female genital tract: Secondary | ICD-10-CM | POA: Insufficient documentation

## 2014-09-17 DIAGNOSIS — S93401A Sprain of unspecified ligament of right ankle, initial encounter: Secondary | ICD-10-CM

## 2014-09-17 DIAGNOSIS — Y998 Other external cause status: Secondary | ICD-10-CM | POA: Insufficient documentation

## 2014-09-17 DIAGNOSIS — X58XXXA Exposure to other specified factors, initial encounter: Secondary | ICD-10-CM | POA: Insufficient documentation

## 2014-09-17 DIAGNOSIS — D649 Anemia, unspecified: Secondary | ICD-10-CM | POA: Insufficient documentation

## 2014-09-17 DIAGNOSIS — Z79899 Other long term (current) drug therapy: Secondary | ICD-10-CM | POA: Insufficient documentation

## 2014-09-17 DIAGNOSIS — Y9344 Activity, trampolining: Secondary | ICD-10-CM | POA: Insufficient documentation

## 2014-09-17 DIAGNOSIS — Y9289 Other specified places as the place of occurrence of the external cause: Secondary | ICD-10-CM | POA: Insufficient documentation

## 2014-09-17 DIAGNOSIS — Z87891 Personal history of nicotine dependence: Secondary | ICD-10-CM | POA: Insufficient documentation

## 2014-09-17 MED ORDER — IBUPROFEN 800 MG PO TABS: 800.0000 mg | ORAL_TABLET | Freq: Three times a day (TID) | ORAL | Status: DC | PRN

## 2014-09-17 MED ORDER — METHOCARBAMOL 500 MG PO TABS: 500.0000 mg | ORAL_TABLET | Freq: Two times a day (BID) | ORAL | Status: DC

## 2014-09-17 MED ORDER — IBUPROFEN 400 MG PO TABS
800.0000 mg | ORAL_TABLET | Freq: Once | ORAL | Status: AC
  Administered 2014-09-17: 800 mg via ORAL
  Filled 2014-09-17: qty 2

## 2014-09-17 NOTE — ED Notes (Signed)
Pt here for right ankle pain after jumping on trampoline with child. Landed on it wrong.

## 2014-09-17 NOTE — Discharge Instructions (Signed)

## 2014-09-17 NOTE — ED Provider Notes (Signed)
CSN: [id number]     Arrival date & time 09/17/14  1236 History   This chart was scribed for non-physician practitioner, Fayrene HelperBowie Renae Mottley, PA-C working with Warnell Foresterrey Wofford, MD, by Abel PrestoKara Demonbreun, ED Scribe. This patient was seen in room TR07C/TR07C and the patient's care was started at 2:50 PM.     Chief Complaint  Patient presents with  . Ankle Injury   Patient is a 28 y.o. female presenting with lower extremity injury. The history is provided by the patient. No language interpreter was used.  Ankle Injury    HPI Comments: Janice Carney is a 28 y.o. female who presents to the Emergency Department complaining of right ankle pain.  Pt report she injured her R ankle last night on the trampoline.  Pt notes associated bruising. Pt was jumping with her son on trampoline last night with her son and landed wrong on her foot. Pt has been using an ice pack for relief.  Pt denies chance of pregnancy.  No fever, numbness, or wound present. No knee or hip injury.  Ambulate afterward  Past Medical History  Diagnosis Date  . No pertinent past medical history   . Ovarian cyst   . Anemia    Past Surgical History  Procedure Laterality Date  . Ovarian cyst surgery     Family History  Problem Relation Age of Onset  . Hypertension Mother   . Cancer Father   . Heart disease Maternal Grandmother    History  Substance Use Topics  . Smoking status: Former Smoker -- 5 years    Types: Cigarettes    Quit date: 07/19/2005  . Smokeless tobacco: Not on file  . Alcohol Use: No     Comment: socially when not pregnant   OB History    Gravida Para Term Preterm AB TAB SAB Ectopic Multiple Living   2 2 2       2      Review of Systems  Constitutional: Negative for fever.  Musculoskeletal: Positive for arthralgias.  Skin: Positive for color change (bruising). Negative for wound.  Neurological: Negative for numbness.      Allergies  Review of patient's allergies indicates no known allergies.  Home  Medications   Prior to Admission medications   Medication Sig Start Date End Date Taking? Authorizing Provider  ferrous sulfate 325 (65 FE) MG tablet Take 1 tablet (325 mg total) by mouth 2 (two) times daily with a meal. 06/19/13   Haroldine LawsJennifer Oxley, CNM  ibuprofen (ADVIL,MOTRIN) 600 MG tablet Take 1 tablet (600 mg total) by mouth every 6 (six) hours. 06/19/13   Haroldine LawsJennifer Oxley, CNM  oxyCODONE-acetaminophen (PERCOCET/ROXICET) 5-325 MG per tablet Take 1-2 tablets by mouth every 4 (four) hours as needed. 06/19/13   Haroldine LawsJennifer Oxley, CNM  Prenatal Vit-Fe Fumarate-FA (PRENATAL MULTIVITAMIN) TABS Take 1 tablet by mouth at bedtime.     Historical Provider, MD   BP 120/76 mmHg  Pulse 82  Temp(Src) 97.9 F (36.6 C) (Oral)  Resp 20  Ht 5\' 3"  (1.6 m)  Wt 130 lb (58.968 kg)  BMI 23.03 kg/m2  SpO2 100%  LMP 09/10/2014 Physical Exam  Constitutional: She is oriented to person, place, and time. She appears well-developed and well-nourished.  HENT:  Head: Normocephalic.  Eyes: Conjunctivae are normal.  Neck: Normal range of motion. Neck supple.  Cardiovascular:  Right foot pedal pulses palpable  Pulmonary/Chest: Effort normal.  Musculoskeletal: She exhibits tenderness.       Right ankle: She exhibits decreased range of  motion. No lateral malleolus and no medial malleolus tenderness found.  Bruising noted to the dorsum of foot proximally with TTP but no crepitus Pain with dorsi flexion, plantar flexion, inversion and eversion Right ankle no tenderness along the posterior malleolal region  Neurological: She is alert and oriented to person, place, and time.  Skin: Skin is warm and dry.  Psychiatric: She has a normal mood and affect. Her behavior is normal.  Nursing note and vitals reviewed.   ED Course  Procedures (including critical care time) DIAGNOSTIC STUDIES: Oxygen Saturation is 100% on room air, normal by my interpretation.    COORDINATION OF CARE: 2:53 PM Discussed treatment plan with  patient at beside, the patient agrees with the plan and has no further questions at this time.  R ankle sprain. Able to ambulate.  Xray neg for acute fx.     Labs Review Labs Reviewed - No data to display  Imaging Review Dg Ankle Complete Right  09/17/2014   CLINICAL DATA:  Jumping on trampoline and landed on right ankle. Patient complains of pain, swelling and bruising on the lateral side.  EXAM: RIGHT ANKLE - COMPLETE 3+ VIEW  COMPARISON:  None.  FINDINGS: Normal alignment without fracture or dislocation. Soft tissues are unremarkable.  IMPRESSION: No acute abnormality.   Electronically Signed   By: Richarda OverlieAdam  Henn M.D.   On: 09/17/2014 14:41     EKG Interpretation None      MDM   Final diagnoses:  Right ankle sprain, initial encounter    I have reviewed nursing notes and vital signs. I personally reviewed the imaging tests through PACS system  I reviewed available ER/hospitalization records thought the EMR   I personally performed the services described in this documentation, which was scribed in my presence. The recorded information has been reviewed and is accurate.     Fayrene HelperBowie Neal Oshea, PA-C 09/17/14 1500  Candyce ChurnJohn David Wofford III, MD 09/19/14 [phone]

## 2015-10-16 ENCOUNTER — Encounter (HOSPITAL_COMMUNITY): Payer: Self-pay | Admitting: Emergency Medicine

## 2015-10-16 ENCOUNTER — Emergency Department (HOSPITAL_COMMUNITY)
Admission: EM | Admit: 2015-10-16 | Discharge: 2015-10-16 | Disposition: A | Discharge status: Home / Self Care | Payer: Medicaid Other | Attending: Emergency Medicine | Admitting: Emergency Medicine

## 2015-10-16 DIAGNOSIS — Z79899 Other long term (current) drug therapy: Secondary | ICD-10-CM | POA: Insufficient documentation

## 2015-10-16 DIAGNOSIS — D649 Anemia, unspecified: Secondary | ICD-10-CM | POA: Insufficient documentation

## 2015-10-16 DIAGNOSIS — Z87891 Personal history of nicotine dependence: Secondary | ICD-10-CM | POA: Insufficient documentation

## 2015-10-16 DIAGNOSIS — M722 Plantar fascial fibromatosis: Secondary | ICD-10-CM

## 2015-10-16 DIAGNOSIS — Z8742 Personal history of other diseases of the female genital tract: Secondary | ICD-10-CM | POA: Insufficient documentation

## 2015-10-16 MED ORDER — IBUPROFEN 600 MG PO TABS: 600.0000 mg | ORAL_TABLET | Freq: Four times a day (QID) | ORAL | Status: DC | PRN

## 2015-10-16 NOTE — ED Notes (Signed)
Right foot pain today, no other complaints, NAD

## 2015-10-16 NOTE — Discharge Instructions (Signed)
Plantar Fasciitis Plantar fasciitis is a painful foot condition that affects the heel. It occurs when the band of tissue that connects the toes to the heel bone (plantar fascia) becomes irritated. This can happen after exercising too much or doing other repetitive activities (overuse injury). The pain from plantar fasciitis can range from mild irritation to severe pain that makes it difficult for you to walk or move. The pain is usually worse in the morning or after you have been sitting or lying down for a while. CAUSES This condition may be caused by:  Standing for long periods of time.  Wearing shoes that do not fit.  Doing high-impact activities, including running, aerobics, and ballet.  Being overweight.  Having an abnormal way of walking (gait).  Having tight calf muscles.  Having high arches in your feet.  Starting a new athletic activity. SYMPTOMS The main symptom of this condition is heel pain. Other symptoms include:  Pain that gets worse after activity or exercise.  Pain that is worse in the morning or after resting.  Pain that goes away after you walk for a few minutes. DIAGNOSIS This condition may be diagnosed based on your signs and symptoms. Your health care provider will also do a physical exam to check for:  A tender area on the bottom of your foot.  A high arch in your foot.  Pain when you move your foot.  Difficulty moving your foot. You may also need to have imaging studies to confirm the diagnosis. These can include:  X-rays.  Ultrasound.  MRI. TREATMENT  Treatment for plantar fasciitis depends on the severity of the condition. Your treatment may include:  Rest, ice, and over-the-counter pain medicines to manage your pain.  Exercises to stretch your calves and your plantar fascia.  A splint that holds your foot in a stretched, upward position while you sleep (night splint).  Physical therapy to relieve symptoms and prevent problems in the  future.  Cortisone injections to relieve severe pain.  Extracorporeal shock wave therapy (ESWT) to stimulate damaged plantar fascia with electrical impulses. It is often used as a last resort before surgery.  Surgery, if other treatments have not worked after 12 months. HOME CARE INSTRUCTIONS  Take medicines only as directed by your health care provider.  Avoid activities that cause pain.  Roll the bottom of your foot over a bag of ice or a bottle of cold water. Do this for 20 minutes, 3-4 times a day.  Perform simple stretches as directed by your health care provider.  Try wearing athletic shoes with air-sole or gel-sole cushions or soft shoe inserts.  Wear a night splint while sleeping, if directed by your health care provider.  Keep all follow-up appointments with your health care provider. PREVENTION   Do not perform exercises or activities that cause heel pain.  Consider finding low-impact activities if you continue to have problems.  Lose weight if you need to. The best way to prevent plantar fasciitis is to avoid the activities that aggravate your plantar fascia. SEEK MEDICAL CARE IF:  Your symptoms do not go away after treatment with home care measures.  Your pain gets worse.  Your pain affects your ability to move or do your daily activities.   This information is not intended to replace advice given to you by your health care provider. Make sure you discuss any questions you have with your health care provider.   Document Released: 06/03/2001 Document Revised: 05/30/2015 Document Reviewed: 07/19/2014 [employer]vier   Interactive Patient Education 2016 [employer]  

## 2015-10-16 NOTE — ED Provider Notes (Signed)
CSN: [id number]     Arrival date & time 10/16/15  1512 History  By signing my name below, I, Rohini Rajnarayanan, attest that this documentation has been prepared under the direction and in the presence of Teressa Lower, NP. Electronically Signed: Charlean Merl, ED Scribe 10/16/2015 at 3:53 PM.   Chief Complaint  Patient presents with  . Foot Pain   The history is provided by the patient. No language interpreter was used.   HPI Comments: Janice Carney is a 30 y.o. female who presents to the Emergency Department complaining of tingling in her angle which began last night. Pt reported that pain had increased by this morning as sharp pain in right posterior ankle which worsens on dorsiflexion. Pt tried no pain medication and has no previous injury.  Past Medical History  Diagnosis Date  . No pertinent past medical history   . Ovarian cyst   . Anemia    Past Surgical History  Procedure Laterality Date  . Ovarian cyst surgery     Family History  Problem Relation Age of Onset  . Hypertension Mother   . Cancer Father   . Heart disease Maternal Grandmother    Social History  Substance Use Topics  . Smoking status: Former Smoker -- 5 years    Types: Cigarettes    Quit date: 07/19/2005  . Smokeless tobacco: Not on file  . Alcohol Use: No     Comment: socially when not pregnant   OB History    Gravida Para Term Preterm AB TAB SAB Ectopic Multiple Living   Review of Systems  Musculoskeletal: Positive for arthralgias (Right ankle). Negative for joint swelling.  All other systems reviewed and are negative.  Allergies  Review of patient's allergies indicates no known allergies.  Home Medications   Prior to Admission medications   Medication Sig Start Date End Date Taking? Authorizing Provider  ferrous sulfate 325 (65 FE) MG tablet Take 1 tablet (325 mg total) by mouth 2 (two) times daily with a meal. 06/19/13   Haroldine Laws, CNM  ibuprofen  (ADVIL,MOTRIN) 800 MG tablet Take 1 tablet (800 mg total) by mouth every 8 (eight) hours as needed for moderate pain. 09/17/14   Fayrene Helper, PA-C  methocarbamol (ROBAXIN) 500 MG tablet Take 1 tablet (500 mg total) by mouth 2 (two) times daily. 09/17/14   Fayrene Helper, PA-C  oxyCODONE-acetaminophen (PERCOCET/ROXICET) 5-325 MG per tablet Take 1-2 tablets by mouth every 4 (four) hours as needed. 06/19/13   Haroldine Laws, CNM  Prenatal Vit-Fe Fumarate-FA (PRENATAL MULTIVITAMIN) TABS Take 1 tablet by mouth at bedtime.     Historical Provider, MD   BP 115/78 mmHg  Pulse 101  Temp(Src) 98.1 F (36.7 C) (Oral)  Resp 20  SpO2 100% Physical Exam  Constitutional: She is oriented to person, place, and time. She appears well-developed and well-nourished. No distress.  HENT:  Head: Normocephalic and atraumatic.  Eyes: Conjunctivae and EOM are normal.  Neck: Neck supple. No tracheal deviation present.  Cardiovascular: Normal rate and intact distal pulses.   Pulmonary/Chest: Effort normal. No respiratory distress.  Musculoskeletal: Normal range of motion. She exhibits tenderness. She exhibits no edema.  Good tactile sensation but TTP. No redness.  Able to flex and extend  Neurological: She is alert and oriented to person, place, and time.  Skin: Skin is warm and dry.  Psychiatric: She has a normal mood and affect. Her behavior  is normal.  Nursing note and vitals reviewed.  ED Course  Procedures (including critical care time) DIAGNOSTIC STUDIES: Oxygen Saturation is 100% on RA, normal by my interpretation.    COORDINATION OF CARE: 3:35 PM-Discussed treatment plan which includes ibuprofen  with pt at bedside and pt agreed to plan.   Labs Review Labs Reviewed - No data to display  Imaging Review No results found.   EKG Interpretation None      MDM   Final diagnoses:  Plantar fasciitis of right foot   Exam consistent with plantar fascitis. Discussed supportive care and follow up  I  personally performed the services described in this documentation, which was scribed in my presence. The recorded information has been reviewed and is accurate.      Teressa Lower, NP 10/16/15 1610  Alvira Monday, MD 10/16/15 2322

## 2017-05-11 ENCOUNTER — Telehealth: Payer: Self-pay | Admitting: *Deleted

## 2017-05-11 NOTE — Telephone Encounter (Signed)
MADE IN ERROR °

## 2017-06-23 ENCOUNTER — Encounter (HOSPITAL_COMMUNITY): Payer: Self-pay

## 2017-06-23 DIAGNOSIS — Z87891 Personal history of nicotine dependence: Secondary | ICD-10-CM | POA: Insufficient documentation

## 2017-06-23 DIAGNOSIS — Z79899 Other long term (current) drug therapy: Secondary | ICD-10-CM | POA: Insufficient documentation

## 2017-06-23 DIAGNOSIS — R200 Anesthesia of skin: Secondary | ICD-10-CM | POA: Insufficient documentation

## 2017-06-23 DIAGNOSIS — R21 Rash and other nonspecific skin eruption: Secondary | ICD-10-CM | POA: Insufficient documentation

## 2017-06-23 DIAGNOSIS — R600 Localized edema: Secondary | ICD-10-CM | POA: Insufficient documentation

## 2017-06-23 NOTE — ED Triage Notes (Signed)
Onset 3 weeks ago red slightly raised rash on bilateral lower extremities.  Rash burns, painful to touch, feet are numb, ankles are swollen.  Rash blanches.  No fever.  Pt not sure of cause.  No one in household has rash.  NO shortness of breath or chest pain.

## 2017-06-24 ENCOUNTER — Emergency Department (HOSPITAL_COMMUNITY)
Admission: EM | Admit: 2017-06-24 | Discharge: 2017-06-24 | Disposition: A | Discharge status: Home / Self Care | Payer: Self-pay | Attending: Emergency Medicine | Admitting: Emergency Medicine

## 2017-06-24 DIAGNOSIS — R609 Edema, unspecified: Secondary | ICD-10-CM

## 2017-06-24 DIAGNOSIS — R21 Rash and other nonspecific skin eruption: Secondary | ICD-10-CM

## 2017-06-24 LAB — CBC
HCT: 32.4 % — ABNORMAL LOW (ref 36.0–46.0)
Hemoglobin: 10.8 g/dL — ABNORMAL LOW (ref 12.0–15.0)
MCH: 29.3 pg (ref 26.0–34.0)
MCHC: 33.3 g/dL (ref 30.0–36.0)
MCV: 87.8 fL (ref 78.0–100.0)
PLATELETS: 250 10*3/uL (ref 150–400)
RBC: 3.69 MIL/uL — AB (ref 3.87–5.11)
RDW: 13 % (ref 11.5–15.5)
WBC: 6.4 10*3/uL (ref 4.0–10.5)

## 2017-06-24 LAB — COMPREHENSIVE METABOLIC PANEL
ALK PHOS: 89 U/L (ref 38–126)
ALT: 17 U/L (ref 14–54)
AST: 21 U/L (ref 15–41)
Albumin: 3.7 g/dL (ref 3.5–5.0)
Anion gap: 9 (ref 5–15)
BILIRUBIN TOTAL: 0.5 mg/dL (ref 0.3–1.2)
BUN: 7 mg/dL (ref 6–20)
CALCIUM: 8.7 mg/dL — AB (ref 8.9–10.3)
CO2: 23 mmol/L (ref 22–32)
Chloride: 104 mmol/L (ref 101–111)
Creatinine, Ser: 0.76 mg/dL (ref 0.44–1.00)
GFR calc non Af Amer: >60 mL/min (ref >60)
Glucose, Bld: 98 mg/dL (ref 65–99)
Potassium: 3.4 mmol/L — ABNORMAL LOW (ref 3.5–5.1)
Sodium: 136 mmol/L (ref 135–145)
TOTAL PROTEIN: 6.5 g/dL (ref 6.5–8.1)

## 2017-06-24 LAB — URINALYSIS, ROUTINE W REFLEX MICROSCOPIC
BACTERIA UA: NONE SEEN
Bilirubin Urine: NEGATIVE
Glucose, UA: NEGATIVE mg/dL
Ketones, ur: NEGATIVE mg/dL
Leukocytes, UA: NEGATIVE
Nitrite: NEGATIVE
PH: 6 (ref 5.0–8.0)
Protein, ur: NEGATIVE mg/dL
SPECIFIC GRAVITY, URINE: 1.018 (ref 1.005–1.030)

## 2017-06-24 LAB — PREGNANCY, URINE: PREG TEST UR: NEGATIVE

## 2017-06-24 MED ORDER — DIPHENHYDRAMINE HCL 25 MG PO TABS: 25.0000 mg | ORAL_TABLET | Freq: Four times a day (QID) | ORAL | 0 refills | Status: DC

## 2017-06-24 MED ORDER — PREDNISONE 10 MG PO TABS: 60.0000 mg | ORAL_TABLET | Freq: Every day | ORAL | 0 refills | Status: DC

## 2017-06-24 MED ORDER — HYDROCORTISONE 1 % EX CREA: TOPICAL_CREAM | CUTANEOUS | 0 refills | Status: DC

## 2017-06-24 NOTE — ED Notes (Signed)
MD Nanvati at bedside

## 2017-06-24 NOTE — ED Provider Notes (Signed)
[facility]-EMERGENCY DEPT Provider Note   CSN: [id number] Arrival date & time: 06/23/17  2025     History   Chief Complaint Chief Complaint  Patient presents with  . Rash  . Leg Swelling    HPI Janice Carney is a 31 y.o. female.  HPI Patient comes in with chief complaint of rash and leg swelling. Patient reports that she started developing a rash in her feet 3 weeks ago Associated with the rash she has numbness in her feet bilaterally, swelling to her ankles. The rash itself has a burning type pain There is no associated itching.  Patient denies any pertinent past medical history. She has no history of skin conditions, autoimmune conditions, allergic reactions. Patient is not on any medications, and denies any herbal supplements. Patient also denies any  New chemical exposures. Patient denies any family history of autoimmune conditions including lupus, sarcoidosis, IBD. At this time patient has no fevers, arthralgias, myalgias.    Past Medical History:  Diagnosis Date  . Anemia   . No pertinent past medical history   . Ovarian cyst     Patient Active Problem List   Diagnosis Date Noted  . NSVD (normal spontaneous vaginal delivery) 06/19/2013  . Second-degree perineal laceration, with delivery 06/19/2013  . Anemia 06/19/2013  . Vaginitis and vulvovaginitis 06/17/2013  . Uterine size date discrepancy 06/17/2013  . Polyhydramnios 06/17/2013  . ADNEXAL MASS 06/19/2010  . VIRAL INFECTION 06/04/2010  . SYNDROME, PELVIC CONGESTION 04/22/2010    Past Surgical History:  Procedure Laterality Date  . OVARIAN CYST SURGERY      OB History    Gravida Para Term Preterm AB Living   SAB TAB Ectopic Multiple Live Births           2       Home Medications    Prior to Admission medications   Medication Sig Start Date End Date Taking? Authorizing Provider  etonogestrel (NEXPLANON) 68 MG IMPL implant 1 each by Subdermal route once.   Yes [provider]    diphenhydrAMINE (BENADRYL) 25 MG tablet Take 1 tablet (25 mg total) by mouth every 6 (six) hours. 06/24/17   Derwood Kaplan, MD  hydrocortisone cream 1 % Apply to affected area 2 times daily 06/24/17   Derwood Kaplan, MD  predniSONE (DELTASONE) 10 MG tablet Take 6 tablets (60 mg total) by mouth daily. 06/24/17   Derwood Kaplan, MD    Family History Family History  Problem Relation Age of Onset  . Hypertension Mother   . Cancer Father   . Heart disease Maternal Grandmother     Social History Social History  Substance Use Topics  . Smoking status: Former Smoker    Years: 5.00    Types: Cigarettes    Quit date: 07/19/2005  . Smokeless tobacco: Never Used  . Alcohol use Yes     Comment: socially      Allergies   Patient has no known allergies.   Review of Systems Review of Systems  Constitutional: Negative for fever.  Skin: Positive for rash.  Allergic/Immunologic: Negative for environmental allergies, food allergies and immunocompromised state.  Neurological: Positive for numbness.  All other systems reviewed and are negative.    Physical Exam Updated Vital Signs BP 113/70   Pulse 86   Temp 98.7 F (37.1 C)   Resp 16   Ht  (1.6 m)   Wt 61.2 kg (135 lb)  LMP 06/22/2017   SpO2 98%   BMI 23.91 kg/m   Physical Exam  Constitutional: She is oriented to person, place, and time. She appears well-developed.  HENT:  Head: Normocephalic and atraumatic.  Eyes: EOM are normal.  Neck: Normal range of motion. Neck supple.  Cardiovascular: Normal rate.   Pulmonary/Chest: Effort normal.  Abdominal: Bowel sounds are normal.  Neurological: She is alert and oriented to person, place, and time.  Skin: Skin is warm and dry. Rash noted. There is erythema.  Erythematous rash, slightly raised / plaque without blistering, scaling limited to lower extremities. There is no warmth to touch   Nursing note and vitals reviewed.        ED Treatments / Results   Labs (all labs ordered are listed, but only abnormal results are displayed) Labs Reviewed  COMPREHENSIVE METABOLIC PANEL - Abnormal; Notable for the following:       Result Value   Potassium 3.4 (*)    Calcium 8.7 (*)    All other components within normal limits  CBC - Abnormal; Notable for the following:    RBC 3.69 (*)    Hemoglobin 10.8 (*)    HCT 32.4 (*)    All other components within normal limits  URINALYSIS, ROUTINE W REFLEX MICROSCOPIC - Abnormal; Notable for the following:    APPearance HAZY (*)    Hgb urine dipstick SMALL (*)    Squamous Epithelial / LPF 0-5 (*)    All other components within normal limits  PREGNANCY, URINE    EKG  EKG Interpretation None       Radiology No results found.  Procedures Procedures (including critical care time)  Medications Ordered in ED Medications - No data to display   Initial Impression / Assessment and Plan / ED Course  I have reviewed the triage vital signs and the nursing notes.  Pertinent labs & imaging results that were available during my care of the patient were reviewed by me and considered in my medical decision making (see chart for details).     Patient comes in with chief complaint of peripheral edema and skin rash. Patient's symptoms started 3 weeks ago. She has no itching with her rash. The rash itself is a blanching erythematous plaque without any scaling. Patient has no new exposures, no new medications, she has no medical conditions including autoimmune conditions, her family history is not concerning and patient has no concerning constitutionals signs on her review of system.  The rash itself looks like contact dermatitis. I also considered erythroderma in the differential diagnosis given that patient does not have any itching. Patient doesn't have insurance or any primary care doctor, given the ambiguity for the rash I did order basic labs and there doesn't appear to be any evidence of ischemia or  nephrotic syndrome. At this time we dont think pt has a skin infection - since there is no warmth to touch, and it would be highly unlikely to present with bilateral lower extremity cellulitis.  We will start patient on prednisone. If the rash persists despite prednisone and then the differential would switch to autoimmune conditions or vasculitis. Strict return precautions have been discussed with the patient and she is comfortable with the plan.  Final Clinical Impressions(s) / ED Diagnoses   Final diagnoses:  Peripheral edema  Rash and nonspecific skin eruption    New Prescriptions New Prescriptions   DIPHENHYDRAMINE (BENADRYL) 25 MG TABLET    Take 1 tablet (25 mg total) by mouth every  6 (six) hours.   HYDROCORTISONE CREAM 1 %    Apply to affected area 2 times daily   PREDNISONE (DELTASONE) 10 MG TABLET    Take 6 tablets (60 mg total) by mouth daily.     Derwood Kaplan, MD 06/24/17 [phone]

## 2017-06-24 NOTE — ED Notes (Addendum)
Pt. Reports having a rash for three weeks on lower legs. Pt. States it does not itch but burns. Pt. 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 Pt. States it goes away when she is at home and not working.

## 2017-06-24 NOTE — Discharge Instructions (Signed)
°  We are not sure what is causing your symptoms. The workup in the ER is not complete, and is limited to screening for life threatening and emergent conditions only, so please see a primary care doctor for further evaluation.  Please return to the ER if your symptoms worsen.

## 2017-08-24 ENCOUNTER — Encounter (INDEPENDENT_AMBULATORY_CARE_PROVIDER_SITE_OTHER): Payer: Self-pay | Admitting: Orthopaedic Surgery

## 2017-08-24 ENCOUNTER — Ambulatory Visit (INDEPENDENT_AMBULATORY_CARE_PROVIDER_SITE_OTHER): Payer: Self-pay | Admitting: Orthopaedic Surgery

## 2017-08-24 DIAGNOSIS — M79641 Pain in right hand: Secondary | ICD-10-CM

## 2017-08-24 DIAGNOSIS — M79642 Pain in left hand: Secondary | ICD-10-CM

## 2017-08-24 NOTE — Progress Notes (Signed)
Office Visit Note   Patient: Janice Carney           Date of Birth: 01/31/1986           MRN: [id number] Visit Date: 08/24/2017              Requested by: Dorena Bodoixon, Mary B, PA-C [address] [place], [place] [id number] PCP: Dorena Bodoixon, Mary B, PA-C   Assessment & Plan: Visit Diagnoses:  1. Pain in both hands     Plan: Impression tunnel syndrome.  Recommend nighttime splinting and nerve conduction studies.  Follow-up after the nerve conduction studies  Follow-Up Instructions: Return if symptoms worsen or fail to improve.   Orders:  Orders Placed This Encounter  Procedures  . Ambulatory referral to Physical Medicine Rehab   No orders of the defined types were placed in this encounter.     Procedures: No procedures performed   Clinical Data: No additional findings.   Subjective: Chief Complaint  Patient presents with  . Hand Pain    bilateral hand pain and numbness-left worse than right    Patient is a 31 year old female numbness worse in the left hand.  This has been going on for several months.  She states the pain is worse in the morning and at night.  There are hands fall asleep with activity.    Review of Systems  Constitutional: Negative.   HENT: Negative.   Eyes: Negative.   Respiratory: Negative.   Cardiovascular: Negative.   Endocrine: Negative.   Musculoskeletal: Negative.   Neurological: Negative.   Hematological: Negative.   Psychiatric/Behavioral: Negative.   All other systems reviewed and are negative.    Objective: Vital Signs: There were no vitals taken for this visit.  Physical Exam  Constitutional: She is oriented to person, place, and time. She appears well-developed and well-nourished.  HENT:  Head: Normocephalic and atraumatic.  Eyes: EOM are normal.  Neck: Neck supple.  Pulmonary/Chest: Effort normal.  Abdominal: Soft.  Neurological: She is alert and oriented to person, place, and time.  Skin: Skin is warm. Capillary  refill takes less than 2 seconds.  Psychiatric: She has a normal mood and affect. Her behavior is normal. Judgment and thought content normal.  Nursing note and vitals reviewed.   Ortho Exam Bilateral hand exam shows no skin changes or swelling.  No signs of infection.  Positive Durkin's and Phalen's sign.  Negative Tinel's.  APB function is intact. Specialty Comments:  No specialty comments available.  Imaging: No results found.   PMFS History: Patient Active Problem List   Diagnosis Date Noted  . NSVD (normal spontaneous vaginal delivery) 06/19/2013  . Second-degree perineal laceration, with delivery 06/19/2013  . Anemia 06/19/2013  . Vaginitis and vulvovaginitis 06/17/2013  . Uterine size date discrepancy 06/17/2013  . Polyhydramnios 06/17/2013  . ADNEXAL MASS 06/19/2010  . VIRAL INFECTION 06/04/2010  . SYNDROME, PELVIC CONGESTION 04/22/2010   Past Medical History:  Diagnosis Date  . Anemia   . No pertinent past medical history   . Ovarian cyst     Family History  Problem Relation Age of Onset  . Hypertension Mother   . Cancer Father   . Heart disease Maternal Grandmother     Past Surgical History:  Procedure Laterality Date  . OVARIAN CYST SURGERY     Social History   Occupational History  . Not on file  Tobacco Use  . Smoking status: Former Smoker    Years: 5.00  Types: Cigarettes    Last attempt to quit: 07/19/2005    Years since quitting: 12.1  . Smokeless tobacco: Never Used  Substance and Sexual Activity  . Alcohol use: Yes    Comment: socially   . Drug use: No  . Sexual activity: Yes

## 2017-09-09 ENCOUNTER — Ambulatory Visit (INDEPENDENT_AMBULATORY_CARE_PROVIDER_SITE_OTHER): Payer: PRIVATE HEALTH INSURANCE | Admitting: Physical Medicine and Rehabilitation

## 2017-09-09 ENCOUNTER — Encounter (INDEPENDENT_AMBULATORY_CARE_PROVIDER_SITE_OTHER): Payer: Self-pay | Admitting: Physical Medicine and Rehabilitation

## 2017-09-09 DIAGNOSIS — R202 Paresthesia of skin: Secondary | ICD-10-CM | POA: Diagnosis not present

## 2017-09-09 NOTE — Progress Notes (Deleted)
Patient comes in today to have Nerve conduction study. Pain, numbness, and  tingling. This has been going on for weeks. Wrist braces helped temporary. Left hand is worse. Currently working in [employer] and  uses hands a lot. Not taking anything for pain.

## 2017-09-10 NOTE — Procedures (Signed)
EMG & NCV Findings: Evaluation of the left median (across palm) sensory nerve showed prolonged distal peak latency (Wrist, 4.1 ms).  All remaining nerves (as indicated in the following tables) were within normal limits.  All left vs. right side differences were within normal limits.    All examined muscles (as indicated in the following table) showed no evidence of electrical instability.    Impression: The above electrodiagnostic study is ABNORMAL and reveals evidence of a mild left median nerve entrapment at the wrist (carpal tunnel syndrome) affecting sensory components.  Careful clinical correlation is paramount as this would not explain the totality of her symptoms.  There is no significant electrodiagnostic evidence of any other focal nerve entrapment, brachial plexopathy or cervical radiculopathy.    As you know, this particular electrodiagnostic study cannot rule out chemical radiculitis or sensory only radiculopathy.  This electrodiagnostic study cannot rule out small fiber polyneuropathy and dysesthesias from central pain sensitization syndromes such as fibromyalgia.  Recommendations: 1.  Follow-up with referring physician. 2.  Continue current management of symptoms. 3.  Continue use of resting splint at night-time and as needed during the day.   Nerve Conduction Studies Anti Sensory Summary Table   Stim Site NR Peak (ms) Norm Peak (ms) P-T Amp (V) Norm P-T Amp Site1 Site2 Delta-P (ms) Dist (cm) Vel (m/s) Norm Vel (m/s)  Left Median Acr Palm Anti Sensory (2nd Digit)  31.4C  Wrist    *4.1 <3.6 29.9 >10 Wrist Palm 2.1 0.0    Palm    2.0 <2.0 23.6         Right Median Acr Palm Anti Sensory (2nd Digit)  34C  Wrist    3.5 <3.6 32.8 >10 Wrist Palm 1.7 0.0    Palm    1.8 <2.0 24.0         Left Radial Anti Sensory (Base 1st Digit)  31.7C  Wrist    1.6 <3.1 20.1  Wrist Base 1st Digit 1.6 0.0    Right Radial Anti Sensory (Base 1st Digit)  33.7C  Wrist    1.7 <3.1 24.1  Wrist  Base 1st Digit 1.7 0.0    Left Ulnar Anti Sensory (5th Digit)  31.7C  Wrist    3.2 <3.7 36.7 >15.0 Wrist 5th Digit 3.2 14.0 44 >38  Right Ulnar Anti Sensory (5th Digit)  34.4C  Wrist    2.9 <3.7 30.7 >15.0 Wrist 5th Digit 2.9 14.0 48 >38   Motor Summary Table   Stim Site NR Onset (ms) Norm Onset (ms) O-P Amp (mV) Norm O-P Amp Site1 Site2 Delta-0 (ms) Dist (cm) Vel (m/s) Norm Vel (m/s)  Left Median Motor (Abd Poll Brev)  31.9C  Wrist    3.8 <4.2 5.8 >5 Elbow Wrist 3.2 18.0 56 >50  Elbow    7.0  8.2         Right Median Motor (Abd Poll Brev)  32.9C  Wrist    3.2 <4.2 8.9 >5 Elbow Wrist 3.2 18.0 56 >50  Elbow    6.4  8.9         Left Ulnar Motor (Abd Dig Min)  31.9C  Wrist    2.6 <4.2 11.1 >3 B Elbow Wrist 2.6 16.5 63 >53  B Elbow    5.2  11.7  A Elbow B Elbow 1.1 9.5 86 >53  A Elbow    6.3  11.7         Right Ulnar Motor (Abd Dig Min)  32.2C  Wrist  2.7 <4.2 10.0 >3 B Elbow Wrist 2.6 17.5 67 >53  B Elbow    5.3  10.3  A Elbow B Elbow 1.2 10.0 83 >53  A Elbow    6.5  10.3          EMG   Side Muscle Nerve Root Ins Act Fibs Psw Amp Dur Poly Recrt Int Dennie BiblePat Comment  Left Abd Poll Brev Median C8-T1 Nml Nml Nml Nml Nml 0 Nml Nml   Left 1stDorInt Ulnar C8-T1 Nml Nml Nml Nml Nml 0 Nml Nml   Left PronatorTeres Median C6-7 Nml Nml Nml Nml Nml 0 Nml Nml   Left Biceps Musculocut C5-6 Nml Nml Nml Nml Nml 0 Nml Nml   Left Deltoid Axillary C5-6 Nml Nml Nml Nml Nml 0 Nml Nml     Nerve Conduction Studies Anti Sensory Left/Right Comparison   Stim Site L Lat (ms) R Lat (ms) L-R Lat (ms) L Amp (V) R Amp (V) L-R Amp (%) Site1 Site2 L Vel (m/s) R Vel (m/s) L-R Vel (m/s)  Median Acr Palm Anti Sensory (2nd Digit)  31.4C  Wrist *4.1 3.5 0.6 29.9 32.8 8.8 Wrist Palm     Palm 2.0 1.8 0.2 23.6 24.0 1.7       Radial Anti Sensory (Base 1st Digit)  31.7C  Wrist 1.6 1.7 0.1 20.1 24.1 16.6 Wrist Base 1st Digit     Ulnar Anti Sensory (5th Digit)  31.7C  Wrist 3.2 2.9 0.3 36.7 30.7 16.3 Wrist 5th  Digit 44 48 4   Motor Left/Right Comparison   Stim Site L Lat (ms) R Lat (ms) L-R Lat (ms) L Amp (mV) R Amp (mV) L-R Amp (%) Site1 Site2 L Vel (m/s) R Vel (m/s) L-R Vel (m/s)  Median Motor (Abd Poll Brev)  31.9C  Wrist 3.8 3.2 0.6 5.8 8.9 34.8 Elbow Wrist 56 56 0  Elbow 7.0 6.4 0.6 8.2 8.9 7.9       Ulnar Motor (Abd Dig Min)  31.9C  Wrist 2.6 2.7 0.1 11.1 10.0 9.9 B Elbow Wrist 63 67 4  B Elbow 5.2 5.3 0.1 11.7 10.3 12.0 A Elbow B Elbow 86 83 3  A Elbow 6.3 6.5 0.2 11.7 10.3 12.0          Waveforms:

## 2017-09-10 NOTE — Progress Notes (Signed)
Janice NorfolkMisti D Carney - 31 y.o. female MRN [id number]  Date of birth: 07/22/1986  Office Visit Note: Visit Date: 09/09/2017 PCP: Dorena Bodoixon, Mary B, PA-C Referred by: Deon Pillingixon, Mary B, PA-C  Subjective: Chief Complaint  Patient presents with  . Left Hand - Pain, Numbness, Tingling  . Right Hand - Pain, Numbness, Tingling   HPI: [name] is a 31 year old female right-hand-dominant followed by Dr. Roda ShuttersXu in the office for her bilateral medicine tingling.  She reports this is been going on for several weeks.  She has been wearing wrist braces that have helped temporarily.  She has numbness and tingling in the radial digits but somewhat nondermatomal E.  The left is worse than the right.  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  Prior cervical MRI or cervical surgery.  She denies diabetes.  She does not report any specific injury.  She actually had prior electrodiagnostic studies but essentially a fairly normal she reports that she was told she has all over the back.  She reports chronic back pain.  That electrodiagnostic study was essentially normal    ROS Otherwise per HPI.  Assessment & Plan: Visit Diagnoses:  1. Paresthesia of skin     Plan: No additional findings.  Impression: The above electrodiagnostic study is ABNORMAL and reveals evidence of a mild left median nerve entrapment at the wrist (carpal tunnel syndrome) affecting sensory components.  Careful clinical correlation is paramount as this would not explain the totality of her symptoms.  There is no significant electrodiagnostic evidence of any other focal nerve entrapment, brachial plexopathy or cervical radiculopathy.    As you know, this particular electrodiagnostic study cannot rule out chemical radiculitis or sensory only radiculopathy.  This electrodiagnostic study cannot rule out small fiber polyneuropathy and dysesthesias from central pain sensitization  syndromes such as fibromyalgia.  Recommendations: 1.  Follow-up with referring physician. 2.  Continue current management of symptoms. 3.  Continue use of resting splint at night-time and as needed during the day.   Meds & Orders: No orders of the defined types were placed in this encounter.   Orders Placed This Encounter  Procedures  . NCV with EMG (electromyography)    Follow-up: Return for Dr. Roda ShuttersXu.   Procedures: No procedures performed  EMG & NCV Findings: Evaluation of the left median (across palm) sensory nerve showed prolonged distal peak latency (Wrist, 4.1 ms).  All remaining nerves (as indicated in the following tables) were within normal limits.  All left vs. right side differences were within normal limits.    All examined muscles (as indicated in the following table) showed no evidence of electrical instability.    Impression: The above electrodiagnostic study is ABNORMAL and reveals evidence of a mild left median nerve entrapment at the wrist (carpal tunnel syndrome) affecting sensory components.  Careful clinical correlation is paramount as this would not explain the totality of her symptoms.  There is no significant electrodiagnostic evidence of any other focal nerve entrapment, brachial plexopathy or cervical radiculopathy.    As you know, this particular electrodiagnostic study cannot rule out chemical radiculitis or sensory only radiculopathy.  This electrodiagnostic study cannot rule out small fiber polyneuropathy and dysesthesias from central pain sensitization syndromes such as fibromyalgia.  Recommendations: 1.  Follow-up with referring physician. 2.  Continue current management of symptoms. 3.  Continue use of resting splint at night-time and as needed during the day.  Nerve Conduction Studies Anti Sensory Summary Table   Stim Site NR Peak (ms) Norm Peak (ms) P-T Amp (V) Norm P-T Amp Site1 Site2 Delta-P (ms) Dist (cm) Vel (m/s) Norm Vel (m/s)  Left  Median Acr Palm Anti Sensory (2nd Digit)  31.4C  Wrist    *4.1 <3.6 29.9 >10 Wrist Palm 2.1 0.0    Palm    2.0 <2.0 23.6         Right Median Acr Palm Anti Sensory (2nd Digit)  34C  Wrist    3.5 <3.6 32.8 >10 Wrist Palm 1.7 0.0    Palm    1.8 <2.0 24.0         Left Radial Anti Sensory (Base 1st Digit)  31.7C  Wrist    1.6 <3.1 20.1  Wrist Base 1st Digit 1.6 0.0    Right Radial Anti Sensory (Base 1st Digit)  33.7C  Wrist    1.7 <3.1 24.1  Wrist Base 1st Digit 1.7 0.0    Left Ulnar Anti Sensory (5th Digit)  31.7C  Wrist    3.2 <3.7 36.7 >15.0 Wrist 5th Digit 3.2 14.0 44 >38  Right Ulnar Anti Sensory (5th Digit)  34.4C  Wrist    2.9 <3.7 30.7 >15.0 Wrist 5th Digit 2.9 14.0 48 >38   Motor Summary Table   Stim Site NR Onset (ms) Norm Onset (ms) O-P Amp (mV) Norm O-P Amp Site1 Site2 Delta-0 (ms) Dist (cm) Vel (m/s) Norm Vel (m/s)  Left Median Motor (Abd Poll Brev)  31.9C  Wrist    3.8 <4.2 5.8 >5 Elbow Wrist 3.2 18.0 56 >50  Elbow    7.0  8.2         Right Median Motor (Abd Poll Brev)  32.9C  Wrist    3.2 <4.2 8.9 >5 Elbow Wrist 3.2 18.0 56 >50  Elbow    6.4  8.9         Left Ulnar Motor (Abd Dig Min)  31.9C  Wrist    2.6 <4.2 11.1 >3 B Elbow Wrist 2.6 16.5 63 >53  B Elbow    5.2  11.7  A Elbow B Elbow 1.1 9.5 86 >53  A Elbow    6.3  11.7         Right Ulnar Motor (Abd Dig Min)  32.2C  Wrist    2.7 <4.2 10.0 >3 B Elbow Wrist 2.6 17.5 67 >53  B Elbow    5.3  10.3  A Elbow B Elbow 1.2 10.0 83 >53  A Elbow    6.5  10.3          EMG   Side Muscle Nerve Root Ins Act Fibs Psw Amp Dur Poly Recrt Int Dennie Bible Comment  Left Abd Poll Brev Median C8-T1 Nml Nml Nml Nml Nml 0 Nml Nml   Left 1stDorInt Ulnar C8-T1 Nml Nml Nml Nml Nml 0 Nml Nml   Left PronatorTeres Median C6-7 Nml Nml Nml Nml Nml 0 Nml Nml   Left Biceps Musculocut C5-6 Nml Nml Nml Nml Nml 0 Nml Nml   Left Deltoid Axillary C5-6 Nml Nml Nml Nml Nml 0 Nml Nml     Nerve Conduction Studies Anti Sensory Left/Right Comparison    Stim Site L Lat (ms) R Lat (ms) L-R Lat (ms) L Amp (V) R Amp (V) L-R Amp (%) Site1 Site2 L Vel (m/s) R Vel (m/s) L-R Vel (m/s)  Median Acr Palm Anti Sensory (2nd Digit)  31.4C  Wrist *4.1 3.5 0.6  29.9 32.8 8.8 Wrist Palm     Palm 2.0 1.8 0.2 23.6 24.0 1.7       Radial Anti Sensory (Base 1st Digit)  31.7C  Wrist 1.6 1.7 0.1 20.1 24.1 16.6 Wrist Base 1st Digit     Ulnar Anti Sensory (5th Digit)  31.7C  Wrist 3.2 2.9 0.3 36.7 30.7 16.3 Wrist 5th Digit 44 48 4   Motor Left/Right Comparison   Stim Site L Lat (ms) R Lat (ms) L-R Lat (ms) L Amp (mV) R Amp (mV) L-R Amp (%) Site1 Site2 L Vel (m/s) R Vel (m/s) L-R Vel (m/s)  Median Motor (Abd Poll Brev)  31.9C  Wrist 3.8 3.2 0.6 5.8 8.9 34.8 Elbow Wrist 56 56 0  Elbow 7.0 6.4 0.6 8.2 8.9 7.9       Ulnar Motor (Abd Dig Min)  31.9C  Wrist 2.6 2.7 0.1 11.1 10.0 9.9 B Elbow Wrist 63 67 4  B Elbow 5.2 5.3 0.1 11.7 10.3 12.0 A Elbow B Elbow 86 83 3  A Elbow 6.3 6.5 0.2 11.7 10.3 12.0          Waveforms:                     Clinical History: EMG Report 12/27/2003      [facility]  /  Shishido, Magaly D.        Division of Neurology        /  [phone]     Hght:      Electromyography Laboratory    /  Age:  31 yr   Sex:  Female                                     /  Ref MD: PASI, Sonia DATE OF STUDY:  12/27/2003             /  PAIN      PDC CLINICAL IMPRESSION: 31 year old female complaining of acute onset severe nonradiating achy low back pain that started 3 years ago. It resolved for awhile but has recurred. It is associated with numbness and tingling in the front and back of the thighs that increases with cold. She has never received injections in her back. She had an MRI of her back over 3 years ago. She used to do gymnastics but is not currently physically active. Exam showed normal power and reflexes in bilateral lower extremities with normal sensation to pinprick. Marked flexibility of the joints was  noted.  SPECIFIC QUESTIONS Evaluate bilateral lumbosacral radiculopathy.  CONCLUSION This is a minimally abnormal study. The spontaneous activity at one level in the lumbosacral paraspinals may indicate right lumbosacral radiculopathy. This could not be confirmed in the limb and the age of it is indeterminate. Imaging study may be beneficial to provide additional information. There is no evidence of left lumbosacral radiculopathy.  She reports that she quit smoking about 12 years ago. Her smoking use included cigarettes. She quit after 5.00 years of use. she has never used smokeless tobacco. No results for input(s): HGBA1C, LABURIC in the last 8760 hours.  Objective:  VS:  HT:    WT:   BMI:     BP:   HR: bpm  TEMP: ( )  RESP:  Physical Exam  Musculoskeletal:  Inspection reveals no atrophy of the bilateral APB or FDI or hand intrinsics. There  is no swelling, color changes, allodynia or dystrophic changes. There is 5 out of 5 strength in the bilateral wrist extension, finger abduction and long finger flexion. There is intact sensation to light touch in all dermatomal and peripheral nerve distributions. There is a negative Hoffmann's test bilaterally.    Ortho Exam Imaging: No results found.  Past Medical/Family/Surgical/Social History: Medications & Allergies reviewed per EMR Patient Active Problem List   Diagnosis Date Noted  . NSVD (normal spontaneous vaginal delivery) 06/19/2013  . Second-degree perineal laceration, with delivery 06/19/2013  . Anemia 06/19/2013  . Vaginitis and vulvovaginitis 06/17/2013  . Uterine size date discrepancy 06/17/2013  . Polyhydramnios 06/17/2013  . ADNEXAL MASS 06/19/2010  . VIRAL INFECTION 06/04/2010  . SYNDROME, PELVIC CONGESTION 04/22/2010   Past Medical History:  Diagnosis Date  . Anemia   . No pertinent past medical history   . Ovarian cyst    Family History  Problem Relation Age of Onset  . Hypertension Mother   . Cancer Father    . Heart disease Maternal Grandmother    Past Surgical History:  Procedure Laterality Date  . OVARIAN CYST SURGERY     Social History   Occupational History  . Not on file  Tobacco Use  . Smoking status: Former Smoker    Years: 5.00    Types: Cigarettes    Last attempt to quit: 07/19/2005    Years since quitting: 12.1  . Smokeless tobacco: Never Used  Substance and Sexual Activity  . Alcohol use: Yes    Comment: socially   . Drug use: No  . Sexual activity: Yes

## 2017-09-21 ENCOUNTER — Encounter: Payer: Self-pay | Admitting: Physician Assistant

## 2017-09-21 ENCOUNTER — Ambulatory Visit (INDEPENDENT_AMBULATORY_CARE_PROVIDER_SITE_OTHER): Payer: PRIVATE HEALTH INSURANCE | Admitting: Physician Assistant

## 2017-09-21 ENCOUNTER — Other Ambulatory Visit: Payer: Self-pay

## 2017-09-21 VITALS — BP 118/82 | HR 86 | Temp 98.0°F | Resp 16 | Ht 62.0 in | Wt 155.6 lb

## 2017-09-21 DIAGNOSIS — G5600 Carpal tunnel syndrome, unspecified upper limb: Secondary | ICD-10-CM

## 2017-09-21 DIAGNOSIS — Z800 Family history of malignant neoplasm of digestive organs: Secondary | ICD-10-CM | POA: Diagnosis not present

## 2017-09-21 DIAGNOSIS — Z0000 Encounter for general adult medical examination without abnormal findings: Secondary | ICD-10-CM | POA: Diagnosis not present

## 2017-09-21 NOTE — Progress Notes (Signed)
Patient ID: Janice NorfolkMisti D Carney MRN: [id number], DOB: 05/24/1986, 31 y.o. Date of Encounter: @DATE @  Chief Complaint:  Chief Complaint  Patient presents with  . New Patient (Initial Visit)    HPI: 31 y.o. year old female  presents as a new patient to establish care.  She reports "this is the first time she has had insurance since she was 31 years old ". She has 2 children--- ages 444 and 32 years old--both are boys. Noted that I saw in her chart that she has Nexplanon.  Asked if she has a gynecologist.  This is when she tells me that she has had no insurance.  Says that she got the Nexplanon at the health department.  Asked if she has any known chronic medical problems. She states that the only thing she knows of is carpal tunnel.  I do see in the computer on 08/24/17 she saw Dr. Roda ShuttersXu at [facility] regarding this.  Today reviewed that family history is significant for father being diagnosed with colon cancer around age 32.  Reviewed that she received the Tdap 06/18/2013. Discussed influenza vaccine today but she defers. Discussed doing Pap smear today.  However she states that because it is [place] Eve she has to go into work early today and was not prepared to do this.  Wants to return for CPE.  Reports that she works 1 day a week at [employer]. 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  She has no specific concerns to address today.   Past Medical History:  Diagnosis Date  . Anemia   . No pertinent past medical history   . Ovarian cyst      Home Meds: Outpatient Medications Prior to Visit  Medication Sig Dispense Refill  . etonogestrel (NEXPLANON) 68 MG IMPL implant 1 each by Subdermal route once.    . diphenhydrAMINE (BENADRYL) 25 MG tablet Take 1 tablet (25 mg total) by mouth every 6 (six) hours. (Patient not taking: Reported on 09/09/2017) 20 tablet 0  . hydrocortisone cream 1 % Apply to affected area 2 times  daily (Patient not taking: Reported on 09/09/2017) 15 g 0  . predniSONE (DELTASONE) 10 MG tablet Take 6 tablets (60 mg total) by mouth daily. (Patient not taking: Reported on 09/09/2017) 30 tablet 0   No facility-administered medications prior to visit.     Allergies: No Known Allergies  Social History   Socioeconomic History  . Marital status: Single    Spouse name: Not on file  . Number of children: Not on file  . Years of education: Not on file  . Highest education level: Not on file  Social Needs  . Financial resource strain: Not on file  . Food insecurity - worry: Not on file  . Food insecurity - inability: Not on file  . Transportation needs - medical: Not on file  . Transportation needs - non-medical: Not on file  Occupational History  . Not on file  Tobacco Use  . Smoking status: Former Smoker    Years: 5.00    Types: Cigarettes    Last attempt to quit: 07/19/2005    Years since quitting: 12.1  . Smokeless tobacco: Never Used  Substance and Sexual Activity  . Alcohol use: Yes    Comment: socially   . Drug use: No  . Sexual activity: Yes  Other Topics Concern  . Not on file  Social History  Narrative  . Not on file    Family History  Problem Relation Age of Onset  . Hypertension Mother   . Cancer Father        Colon Cancer at ~ age 32  . Heart disease Maternal Grandmother   . Breast cancer Paternal Grandmother      Review of Systems:  See HPI for pertinent ROS. All other ROS negative.    Physical Exam: Blood pressure 118/82, pulse 86, temperature 98 F (36.7 C), temperature source Oral, resp. rate 16, height 5\' 2"  (1.575 m), weight 70.6 kg (155 lb 9.6 oz), SpO2 98%, Body mass index is 28.46 kg/m. General: WNWD WF. Appears in no acute distress. Head: Normocephalic, atraumatic, eyes without discharge, sclera non-icteric, nares are without discharge. Bilateral auditory canals clear, TM's are without perforation, pearly grey and translucent with reflective  cone of light bilaterally. Oral cavity moist, posterior pharynx without exudate, erythema, peritonsillar abscess, or post nasal drip.  Neck: Supple. No thyromegaly. No lymphadenopathy. Lungs: Clear bilaterally to auscultation without wheezes, rales, or rhonchi. Breathing is unlabored. Heart: RRR with S1 S2. No murmurs, rubs, or gallops. Musculoskeletal:  Strength and tone normal for age. Extremities/Skin: Warm and dry. Neuro: Alert and oriented X 3. Moves all extremities spontaneously. Gait is normal. CNII-XII grossly in tact. Psych:  Responds to questions appropriately with a normal affect.     ASSESSMENT AND PLAN:  31 y.o. year old female with  1. Encounter for medical examination to establish care  2. Family history of colon cancer in father Father was diagnosed with colon cancer around age 32. Discussed this with her today and discussed that she will need to start colorectal cancer screening at age 31.  3. Carpal tunnel syndrome, unspecified laterality Managed by Dr. Roda ShuttersXu  She is scheduling a complete physical exam for one morning in the near future.  She will come fasting to that visit.  Will check screening labs and Pap smear in addition to doing complete physical exam at that visit.   Signed, [address]Lavra Imler Beth CaminoDixon, [place], [facility] 09/21/2017 9:02 AM

## 2017-09-25 ENCOUNTER — Encounter (INDEPENDENT_AMBULATORY_CARE_PROVIDER_SITE_OTHER): Payer: Self-pay | Admitting: Orthopaedic Surgery

## 2017-09-25 ENCOUNTER — Ambulatory Visit (INDEPENDENT_AMBULATORY_CARE_PROVIDER_SITE_OTHER): Payer: PRIVATE HEALTH INSURANCE | Admitting: Orthopaedic Surgery

## 2017-09-25 DIAGNOSIS — G5603 Carpal tunnel syndrome, bilateral upper limbs: Secondary | ICD-10-CM | POA: Diagnosis not present

## 2017-09-25 MED ORDER — METHYLPREDNISOLONE ACETATE 40 MG/ML IJ SUSP
40.0000 mg | INTRAMUSCULAR | Status: AC | PRN
  Administered 2017-09-25: 40 mg

## 2017-09-25 MED ORDER — LIDOCAINE HCL 1 % IJ SOLN
1.0000 mL | INTRAMUSCULAR | Status: AC | PRN
  Administered 2017-09-25: 1 mL

## 2017-09-25 MED ORDER — BUPIVACAINE HCL 0.25 % IJ SOLN
0.3300 mL | INTRAMUSCULAR | Status: AC | PRN
  Administered 2017-09-25: .33 mL

## 2017-09-25 NOTE — Progress Notes (Signed)
Office Visit Note   Patient: Janice NorfolkMisti D Carney           Date of Birth: 01/05/1986           MRN: [id number] Visit Date: 09/25/2017              Requested by: Dorena Bodoixon, Mary B, PA-C [address] [place], [place] [id number] PCP: Dorena Bodoixon, Mary B, PA-C   Assessment & Plan: Visit Diagnoses:  1. Carpal tunnel syndrome, bilateral     Plan: Given the lack of severity of the median nerve entrapment on the recent EMGs of both upper extremities, we feel it is appropriate to proceed with bilateral carpal tunnel injections.  We are hopeful that the injections will significantly help her symptoms.  Follow-Up Instructions: Return if symptoms worsen or fail to improve.   Orders:  Orders Placed This Encounter  Procedures  . Hand/UE Inj: bilateral carpal tunnel   No orders of the defined types were placed in this encounter.     Procedures: Hand/UE Inj: bilateral carpal tunnel for carpal tunnel syndrome on 09/25/2017 11:01 AM Indications: pain Details: 25 G needle, volar approach Medications (Right): 0.33 mL bupivacaine 0.25 %; 1 mL lidocaine 1 %; 40 mg methylPREDNISolone acetate 40 MG/ML Medications (Left): 0.33 mL bupivacaine 0.25 %; 1 mL lidocaine 1 %; 40 mg methylPREDNISolone acetate 40 MG/ML      Clinical Data: No additional findings.   Subjective: Chief Complaint  Patient presents with  . Left Wrist - Pain, Follow-up  . Right Wrist - Pain, Follow-up    HPI this is a pleasant 32 year old female who presents to our clinic today to discuss EMG studies bilateral upper extremities.  EMG study from 09/09/2017 reveals mild median nerve entrapment on the left.  No evidence of nerve entrapment on the right.  [name] remains symptomatic at this point.  She has been wearing her removable wrist splints at night as well as at work.  These have minimally helped.  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    Review of Systems as detailed  in HPI all others reviewed and are negative.   Objective: Vital Signs: There were no vitals taken for this visit.  Physical Exam well-developed well-nourished female in no acute distress.   Specialty Comments:  No specialty comments available.  Imaging: Nerve conduction studies from 09/09/2017 of  bilateral upper extremities reveal mild median nerve entrapment on the left.  Negative EMGs on the right.   PMFS History: Patient Active Problem List   Diagnosis Date Noted  . Carpal tunnel syndrome, bilateral 09/25/2017  . Family history of colon cancer in father 09/21/2017  . NSVD (normal spontaneous vaginal delivery) 06/19/2013  . Second-degree perineal laceration, with delivery 06/19/2013  . Anemia 06/19/2013  . Vaginitis and vulvovaginitis 06/17/2013  . Uterine size date discrepancy 06/17/2013  . Polyhydramnios 06/17/2013  . ADNEXAL MASS 06/19/2010  . VIRAL INFECTION 06/04/2010  . SYNDROME, PELVIC CONGESTION 04/22/2010   Past Medical History:  Diagnosis Date  . Anemia   . No pertinent past medical history   . Ovarian cyst     Family History  Problem Relation Age of Onset  . Hypertension Mother   . Cancer Father        Colon Cancer at ~ age 33  . Heart disease Maternal Grandmother   . Breast cancer Paternal Grandmother     Past Surgical History:  Procedure Laterality Date  .  OVARIAN CYST SURGERY     Social History   Occupational History  . Not on file  Tobacco Use  . Smoking status: Former Smoker    Years: 5.00    Types: Cigarettes    Last attempt to quit: 07/19/2005    Years since quitting: 12.1  . Smokeless tobacco: Never Used  Substance and Sexual Activity  . Alcohol use: Yes    Comment: socially   . Drug use: No  . Sexual activity: Yes

## 2017-10-08 ENCOUNTER — Encounter: Payer: PRIVATE HEALTH INSURANCE | Admitting: Physician Assistant

## 2018-02-19 ENCOUNTER — Ambulatory Visit (INDEPENDENT_AMBULATORY_CARE_PROVIDER_SITE_OTHER): Payer: BLUE CROSS/BLUE SHIELD | Admitting: Family Medicine

## 2018-02-19 ENCOUNTER — Other Ambulatory Visit: Payer: Self-pay

## 2018-02-19 ENCOUNTER — Encounter: Payer: Self-pay | Admitting: Family Medicine

## 2018-02-19 VITALS — BP 126/80 | HR 78 | Temp 98.3°F | Resp 15 | Ht 63.0 in | Wt 143.2 lb

## 2018-02-19 DIAGNOSIS — R600 Localized edema: Secondary | ICD-10-CM | POA: Diagnosis not present

## 2018-02-19 DIAGNOSIS — R21 Rash and other nonspecific skin eruption: Secondary | ICD-10-CM | POA: Diagnosis not present

## 2018-02-19 LAB — COMPLETE METABOLIC PANEL WITH GFR
AG RATIO: 1.6 (calc) (ref 1.0–2.5)
ALT: 14 U/L (ref 6–29)
AST: 17 U/L (ref 10–30)
Albumin: 4.2 g/dL (ref 3.6–5.1)
Alkaline phosphatase (APISO): 80 U/L (ref 33–115)
BILIRUBIN TOTAL: 0.5 mg/dL (ref 0.2–1.2)
BUN: 15 mg/dL (ref 7–25)
CALCIUM: 9.1 mg/dL (ref 8.6–10.2)
CHLORIDE: 106 mmol/L (ref 98–110)
CO2: 25 mmol/L (ref 20–32)
Creat: 0.78 mg/dL (ref 0.50–1.10)
GFR, EST NON AFRICAN AMERICAN: 100 mL/min/{1.73_m2} (ref >=60)
GFR, Est African American: 116 mL/min/{1.73_m2} (ref >=60)
GLOBULIN: 2.6 g/dL (ref 1.9–3.7)
Glucose, Bld: 80 mg/dL (ref 65–99)
POTASSIUM: 5.3 mmol/L (ref 3.5–5.3)
SODIUM: 140 mmol/L (ref 135–146)
Total Protein: 6.8 g/dL (ref 6.1–8.1)

## 2018-02-19 LAB — URINALYSIS, ROUTINE W REFLEX MICROSCOPIC
BILIRUBIN URINE: NEGATIVE
Glucose, UA: NEGATIVE
Ketones, ur: NEGATIVE
NITRITE: NEGATIVE
PH: 6.5 (ref 5.0–8.0)
SPECIFIC GRAVITY, URINE: 1.025 (ref 1.001–1.03)

## 2018-02-19 LAB — CBC WITH DIFFERENTIAL/PLATELET
Basophils Absolute: 52 cells/uL (ref 0–200)
Basophils Relative: 1 %
Eosinophils Absolute: 120 cells/uL (ref 15–500)
Eosinophils Relative: 2.3 %
HEMATOCRIT: 38.3 % (ref 35.0–45.0)
Hemoglobin: 13.2 g/dL (ref 11.7–15.5)
LYMPHS ABS: 1565 {cells}/uL (ref 850–3900)
MCH: 30.3 pg (ref 27.0–33.0)
MCHC: 34.5 g/dL (ref 32.0–36.0)
MCV: 88 fL (ref 80.0–100.0)
MPV: 9.9 fL (ref 7.5–12.5)
Monocytes Relative: 11.4 %
NEUTROS PCT: 55.2 %
Neutro Abs: 2870 cells/uL (ref 1500–7800)
Platelets: 275 10*3/uL (ref 140–400)
RBC: 4.35 10*6/uL (ref 3.80–5.10)
RDW: 11.8 % (ref 11.0–15.0)
Total Lymphocyte: 30.1 %
WBC: 5.2 10*3/uL (ref 3.8–10.8)
WBCMIX: 593 {cells}/uL (ref 200–950)

## 2018-02-19 LAB — MICROSCOPIC MESSAGE

## 2018-02-19 MED ORDER — PREDNISONE 20 MG PO TABS: ORAL_TABLET | ORAL | 0 refills | Status: DC

## 2018-02-19 MED ORDER — CETIRIZINE HCL 10 MG PO TABS: 10.0000 mg | ORAL_TABLET | Freq: Every day | ORAL | 11 refills | Status: DC

## 2018-02-19 MED ORDER — MONTELUKAST SODIUM 10 MG PO TABS
10.0000 mg | ORAL_TABLET | Freq: Every day | ORAL | 3 refills | Status: DC
Start: 2018-02-19 — End: 2018-06-01

## 2018-02-19 NOTE — Patient Instructions (Addendum)
Try steroids again.  Use compression stockings - over the counter, light weight (do not need 200 mmHg +, less than this will be okay)  Going to try a daily allergy medicine to see if we can block this skin reaction  Apply petroleum jelly/Vaseline ointment to your legs and feet daily to help increase hydration and decrease the skin reaction to dryness and stretching.  The swelling worsens and to elevate your legs and can apply ice to the areas.  My suspicion is this is something allergic or autoimmune and most likely in reaction to the heat or the swelling of your legs which is common for understanding long periods of time or working in the heat.      I will call you with lab work.    If steroids, hydration, icing, and compression does not work, we can try a diuretic for a few days, please call me next week and let me know if rash is still occurring.    Atopic Dermatitis Atopic dermatitis is a skin disorder that causes inflammation of the skin. This is the most common type of eczema. Eczema is a group of skin conditions that cause the skin to be itchy, red, and swollen. This condition is generally worse during the cooler winter months and often improves during the warm summer months. Symptoms can vary from person to person. Atopic dermatitis usually starts showing signs in infancy and can last through adulthood. This condition cannot be passed from one person to another (non-contagious), but it is more common in families. Atopic dermatitis may not always be present. When it is present, it is called a flare-up. What are the causes? The exact cause of this condition is not known. Flare-ups of the condition may be triggered by:  Contact with something that you are sensitive or allergic to.  Stress.  Certain foods.  Extremely hot or cold weather.  Harsh chemicals and soaps.  Dry air.  Chlorine.  What increases the risk? This condition is more likely to develop in people who have a  personal history or family history of eczema, allergies, asthma, or hay fever. What are the signs or symptoms? Symptoms of this condition include:  Dry, scaly skin.  Red, itchy rash.  Itchiness, which can be severe. This may occur before the skin rash. This can make sleeping difficult.  Skin thickening and cracking that can occur over time.  How is this diagnosed? This condition is diagnosed based on your symptoms, a medical history, and a physical exam. How is this treated? There is no cure for this condition, but symptoms can usually be controlled. Treatment focuses on:  Controlling the itchiness and scratching. You may be given medicines, such as antihistamines or steroid creams.  Limiting exposure to things that you are sensitive or allergic to (allergens).  Recognizing situations that cause stress and developing a plan to manage stress.  If your atopic dermatitis does not get better with medicines, or if it is all over your body (widespread), a treatment using a specific type of light (phototherapy) may be used. Follow these instructions at home: Skin care  Keep your skin well-moisturized. Doing this seals in moisture and helps to prevent dryness. ? Use unscented lotions that have petroleum in them. ? Avoid lotions that contain alcohol or water. They can dry the skin.  Keep baths or showers short (less than 5 minutes) in warm water. Do not use hot water. ? Use mild, unscented cleansers for bathing. Avoid soap and bubble bath. ?  Apply a moisturizer to your skin right after a bath or shower.  Do not apply anything to your skin without checking with your health care provider. General instructions  Dress in clothes made of cotton or cotton blends. Dress lightly because heat increases itchiness.  When washing your clothes, rinse your clothes twice so all of the soap is removed.  Avoid any triggers that can cause a flare-up.  Try to manage your stress.  Keep your  fingernails cut short.  Avoid scratching. Scratching makes the rash and itchiness worse. It may also result in a skin infection (impetigo) due to a break in the skin caused by scratching.  Take or apply over-the-counter and prescription medicines only as told by your health care provider.  Keep all follow-up visits as told by your health care provider. This is important.  Do not be around people who have cold sores or fever blisters. If you get the infection, it may cause your atopic dermatitis to worsen. Contact a health care provider if:  Your itchiness interferes with sleep.  Your rash gets worse or it is not better within one week of starting treatment.  You have a fever.  You have a rash flare-up after having contact with someone who has cold sores or fever blisters. Get help right away if:  You develop pus or soft yellow scabs in the rash area. Summary  This condition causes a red rash and itchy, dry, scaly skin.  Treatment focuses on controlling the itchiness and scratching, limiting exposure to things that you are sensitive or allergic to (allergens), recognizing situations that cause stress, and developing a plan to manage stress.  Keep your skin well-moisturized.  Keep baths or showers shorter than 5 minutes and use warm water. Do not use hot water. This information is not intended to replace advice given to you by your health care provider. Make sure you discuss any questions you have with your health care provider. Document Released: 09/05/2000 Document Revised: 10/10/2016 Document Reviewed: 10/10/2016 Elsevier Interactive Patient Education  2018 [employer].    Peripheral Edema Peripheral edema is swelling that is caused by a buildup of fluid. Peripheral edema most often affects the lower legs, ankles, and feet. It can also develop in the arms, hands, and face. The area of the body that has peripheral edema will look swollen. It may also feel heavy or warm. Your  clothes may start to feel tight. Pressing on the area may make a temporary dent in your skin. You may not be able to move your arm or leg as much as usual. There are many causes of peripheral edema. It can be a complication of other diseases, such as congestive heart failure, kidney disease, or a problem with your blood circulation. It also can be a side effect of certain medicines. It often happens to women during pregnancy. Sometimes, the cause is not known. Treating the underlying condition is often the only treatment for peripheral edema. Follow these instructions at home: Pay attention to any changes in your symptoms. Take these actions to help with your discomfort:  Raise (elevate) your legs while you are sitting or lying down.  Move around often to prevent stiffness and to lessen swelling. Do not sit or stand for long periods of time.  Wear support stockings as told by your health care provider.  Follow instructions from your health care provider about limiting salt (sodium) in your diet. Sometimes eating less salt can reduce swelling.  Take over-the-counter and prescription  medicines only as told by your health care provider. Your health care provider may prescribe medicine to help your body get rid of excess water (diuretic).  Keep all follow-up visits as told by your health care provider. This is important.  Contact a health care provider if:  You have a fever.  Your edema starts suddenly or is getting worse, especially if you are pregnant or have a medical condition.  You have swelling in only one leg.  You have increased swelling and pain in your legs. Get help right away if:  You develop shortness of breath, especially when you are lying down.  You have pain in your chest or abdomen.  You feel weak.  You faint. This information is not intended to replace advice given to you by your health care provider. Make sure you discuss any questions you have with your health care  provider. Document Released: 10/16/2004 Document Revised: 02/11/2016 Document Reviewed: 03/21/2015 Elsevier Interactive Patient Education  [employer].

## 2018-02-19 NOTE — Progress Notes (Signed)
Patient ID: Janice Carney, female    DOB: 1986/09/11, 32 y.o.   MRN: [id number]  PCP: Dorena Bodo, PA-C  Chief Complaint  Patient presents with  . Foot Swelling    Patient in today for rash and swelling to bilateral lower legs. Rash has resolved but patient states comes and goes    Subjective:   Janice Carney is a 32 y.o. female, presents to clinic with CC of intermittent recurrent swelling and rash to bilateral lower extremities.  When rash occurs it is painful and has a stinging sensation, severity is mild to moderate in nature.  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     She has had this multiple times before, one such occurrence was evaluated in the ER October 2018, her work-up there was significant for mild anemia and trace blood in urine and otherwise is unremarkable.  She was treated with a burst of steroids, topical steroid cream as well and control.  She has never tried compression stockings. She denies any chest pain, shortness of breath orthopnea, PND, acute weight gain, palpitations or near syncope, decreased urine output.    Patient Active Problem List   Diagnosis Date Noted  . Carpal tunnel syndrome, bilateral 09/25/2017  . Family history of colon cancer in father 09/21/2017  . NSVD (normal spontaneous vaginal delivery) 06/19/2013  . Second-degree perineal laceration, with delivery 06/19/2013  . Anemia 06/19/2013  . Vaginitis and vulvovaginitis 06/17/2013  . Uterine size date discrepancy 06/17/2013  . Polyhydramnios 06/17/2013  . ADNEXAL MASS 06/19/2010  . VIRAL INFECTION 06/04/2010  . SYNDROME, PELVIC CONGESTION 04/22/2010     Prior to Admission medications   Medication Sig Start Date End Date Taking? Authorizing Provider  etonogestrel (NEXPLANON) 68 MG IMPL implant 1 each by Subdermal route once.   Yes [provider]     No Known Allergies   Family History  Problem Relation Age of  Onset  . Hypertension Mother   . Cancer Father        Colon Cancer at ~ age 63  . Heart disease Maternal Grandmother   . Breast cancer Paternal Grandmother      Social History   Socioeconomic History  . Marital status: Single    Spouse name: Not on file  . Number of children: Not on file  . Years of education: Not on file  . Highest education level: Not on file  Occupational History  . Not on file  Social Needs  . Financial resource strain: Not on file  . Food insecurity:    Worry: Not on file    Inability: Not on file  . Transportation needs:    Medical: Not on file    Non-medical: Not on file  Tobacco Use  . Smoking status: Former Smoker    Years: 5.00    Types: Cigarettes    Last attempt to quit: 07/19/2005    Years since quitting: 12.5  . Smokeless tobacco: Never Used  Substance and Sexual Activity  . Alcohol use: Yes    Comment: socially   . Drug use: No  . Sexual activity: Yes  Lifestyle  . Physical activity:    Days per week: Not on file    Minutes per session: Not on file  . Stress: Not on file  Relationships  . Social connections:    Talks on phone: Not on file    Gets together: Not  on file    Attends religious service: Not on file    Active member of club or organization: Not on file    Attends meetings of clubs or organizations: Not on file    Relationship status: Not on file  . Intimate partner violence:    Fear of current or ex partner: Not on file    Emotionally abused: Not on file    Physically abused: Not on file    Forced sexual activity: Not on file  Other Topics Concern  . Not on file  Social History Narrative  . Not on file     Review of Systems  All other systems reviewed and are negative.      Objective:    Vitals:   02/19/18 0917  BP: 126/80  Pulse: 78  Resp: 15  Temp: 98.3 F (36.8 C)  TempSrc: Oral  SpO2: 99%  Weight: 143 lb 4 oz (65 kg)  Height: 5\' 3"  (1.6 m)      Physical Exam  Constitutional: She  appears well-developed and well-nourished. No distress.  HENT:  Head: Normocephalic and atraumatic.  Right Ear: External ear normal.  Left Ear: External ear normal.  Nose: Nose normal.  Mouth/Throat: Oropharynx is clear and moist. No oropharyngeal exudate.  Eyes: Pupils are equal, round, and reactive to light. Conjunctivae and EOM are normal. Right eye exhibits no discharge. Left eye exhibits no discharge. No scleral icterus.  Neck: Normal range of motion. Neck supple. No JVD present. No tracheal deviation present.  Cardiovascular: Normal rate, regular rhythm, normal heart sounds and intact distal pulses. Exam reveals no gallop and no friction rub.  No murmur heard. Nonpitting bilateral lower extremity edema to feet, ankles and lower half of shins  Pulmonary/Chest: Effort normal and breath sounds normal. No stridor. No respiratory distress. She has no wheezes. She has no rales. She exhibits no tenderness.  Abdominal: Soft. Bowel sounds are normal. She exhibits no distension. There is no tenderness.  Musculoskeletal: Normal range of motion. She exhibits no tenderness or deformity.  Lymphadenopathy:    She has no cervical adenopathy.  Neurological: She is alert. She exhibits normal muscle tone. Coordination normal.  Skin: Skin is warm and dry. No rash noted. She is not diaphoretic.  Mild edema to bilateral lower extremities feet, ankles and shins with dry and shiny skin with some scattered erythema and tenderness to palpation, no current raised rash or hives.   Psychiatric: She has a normal mood and affect. Her behavior is normal.  Nursing note and vitals reviewed.  Legs in clinic 02/19/18:     Photo of pt's image on her cell phone of legs when rash is presents:        Results for orders placed or performed in visit on 02/19/18  CBC with Differential  Result Value Ref Range   WBC 5.2 3.8 - 10.8 Thousand/uL   RBC 4.35 3.80 - 5.10 Million/uL   Hemoglobin 13.2 11.7 - 15.5 g/dL   HCT  16.138.3 09.635.0 - 04.545.0 %   MCV 88.0 80.0 - 100.0 fL   MCH 30.3 27.0 - 33.0 pg   MCHC 34.5 32.0 - 36.0 g/dL   RDW 40.911.8 81.111.0 - 91.415.0 %   Platelets 275 140 - 400 Thousand/uL   MPV 9.9 7.5 - 12.5 fL   Neutro Abs 2,870 1,500 - 7,800 cells/uL   Lymphs Abs 1,565 850 - 3,900 cells/uL   WBC mixed population 593 200 - 950 cells/uL   Eosinophils Absolute 120 15 -  500 cells/uL   Basophils Absolute 52 0 - 200 cells/uL   Neutrophils Relative % 55.2 %   Total Lymphocyte 30.1 %   Monocytes Relative 11.4 %   Eosinophils Relative 2.3 %   Basophils Relative 1.0 %  COMPLETE METABOLIC PANEL WITH GFR  Result Value Ref Range   Glucose, Bld 80 65 - 99 mg/dL   BUN 15 7 - 25 mg/dL   Creat 1.19 1.47 - 8.29 mg/dL   GFR, Est Non African American 100 > OR = 60 mL/min/1.44m2   GFR, Est African American 116 > OR = 60 mL/min/1.41m2   BUN/Creatinine Ratio NOT APPLICABLE 6 - 22 (calc)   Sodium 140 135 - 146 mmol/L   Potassium 5.3 3.5 - 5.3 mmol/L   Chloride 106 98 - 110 mmol/L   CO2 25 20 - 32 mmol/L   Calcium 9.1 8.6 - 10.2 mg/dL   Total Protein 6.8 6.1 - 8.1 g/dL   Albumin 4.2 3.6 - 5.1 g/dL   Globulin 2.6 1.9 - 3.7 g/dL (calc)   AG Ratio 1.6 1.0 - 2.5 (calc)   Total Bilirubin 0.5 0.2 - 1.2 mg/dL   Alkaline phosphatase (APISO) 80 33 - 115 U/L   AST 17 10 - 30 U/L   ALT 14 6 - 29 U/L  Urinalysis, Routine w reflex microscopic  Result Value Ref Range   Color, Urine YELLOW YELLOW   APPearance CLOUDY (A) CLEAR   Specific Gravity, Urine 1.025 1.001 - 1.03   pH 6.5 5.0 - 8.0   Glucose, UA NEGATIVE NEGATIVE   Bilirubin Urine NEGATIVE NEGATIVE   Ketones, ur NEGATIVE NEGATIVE   Hgb urine dipstick 2+ (A) NEGATIVE   Protein, ur TRACE (A) NEGATIVE   Nitrite NEGATIVE NEGATIVE   Leukocytes, UA TRACE (A) NEGATIVE   WBC, UA 0-5 0 - 5 /HPF   RBC / HPF 3-10 (A) 0 - 2 /HPF   Squamous Epithelial / LPF 0-5 < OR = 5 /HPF   Bacteria, UA MODERATE (A) NONE SEEN /HPF   Urine-Other    Microscopic Message  Result Value Ref  Range   Note          Assessment & Plan:      ICD-10-CM   1. Bilateral lower extremity edema R60.0 CBC with Differential    COMPLETE METABOLIC PANEL WITH GFR    Urinalysis, Routine w reflex microscopic  2. 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  She had one instance of this last October but it resolved, now she has it every day, rash is painful, she reports it does not seem to improve with Benadryl.    Unclear if this is a dermatitis secondary to lower extremity edema, atopic dermatitis, heat rash or a allergic reaction type rash.  Will attempt to cover with steroids, daily allergy medicine, increase hydration to skin in the affected area, compression stockings, elevation of legs.  Obtained basic lab work to evaluate for white count, eosinophil count, kidney function, LFTs, urine.  Patient's lab work was largely unremarkable, normal white count, no increased eosinophil percentage, normal kidney function, normal proteins and LFTs.  Her urinalysis in office was abnormal with hematuria, moderate bacteria, trace protein, trace  leukocytes.  She was asymptomatic, will have return for recheck of urine to make sure there is no persistent hematuria.    If not improving patient was instructed to contact us, would be willing to try a few days of Lasix.  If it appears to be from the like chronic idiopathic urticaria, patient may benefit from dermatology or allergist evaluation  Danelle Berry, PA-C 02/19/18 9:27 AM

## 2018-04-05 ENCOUNTER — Encounter: Payer: Self-pay | Admitting: Physician Assistant

## 2018-04-10 DIAGNOSIS — N912 Amenorrhea, unspecified: Secondary | ICD-10-CM | POA: Diagnosis not present

## 2018-04-10 DIAGNOSIS — L258 Unspecified contact dermatitis due to other agents: Secondary | ICD-10-CM | POA: Diagnosis not present

## 2018-04-10 DIAGNOSIS — M79604 Pain in right leg: Secondary | ICD-10-CM | POA: Diagnosis not present

## 2018-04-10 DIAGNOSIS — R229 Localized swelling, mass and lump, unspecified: Secondary | ICD-10-CM | POA: Diagnosis not present

## 2018-04-10 DIAGNOSIS — M79605 Pain in left leg: Secondary | ICD-10-CM | POA: Diagnosis not present

## 2018-05-06 DIAGNOSIS — L309 Dermatitis, unspecified: Secondary | ICD-10-CM | POA: Diagnosis not present

## 2018-05-17 ENCOUNTER — Ambulatory Visit: Payer: BLUE CROSS/BLUE SHIELD | Admitting: Physician Assistant

## 2018-05-20 ENCOUNTER — Other Ambulatory Visit: Payer: Self-pay

## 2018-05-20 ENCOUNTER — Ambulatory Visit (INDEPENDENT_AMBULATORY_CARE_PROVIDER_SITE_OTHER): Payer: BLUE CROSS/BLUE SHIELD | Admitting: Physician Assistant

## 2018-05-20 ENCOUNTER — Encounter: Payer: Self-pay | Admitting: Physician Assistant

## 2018-05-20 VITALS — BP 122/84 | HR 80 | Temp 98.3°F | Resp 18 | Ht 63.0 in | Wt 149.4 lb

## 2018-05-20 DIAGNOSIS — B9689 Other specified bacterial agents as the cause of diseases classified elsewhere: Secondary | ICD-10-CM | POA: Diagnosis not present

## 2018-05-20 DIAGNOSIS — J988 Other specified respiratory disorders: Secondary | ICD-10-CM | POA: Diagnosis not present

## 2018-05-20 DIAGNOSIS — H1031 Unspecified acute conjunctivitis, right eye: Secondary | ICD-10-CM

## 2018-05-20 MED ORDER — SULFACETAMIDE SODIUM 10 % OP SOLN: 2.0000 [drp] | Freq: Four times a day (QID) | OPHTHALMIC | 0 refills | Status: AC

## 2018-05-20 MED ORDER — AMOXICILLIN 875 MG PO TABS: 875.0000 mg | ORAL_TABLET | Freq: Two times a day (BID) | ORAL | 0 refills | Status: DC

## 2018-05-20 NOTE — Progress Notes (Signed)
Patient ID: LARITZA VOKES MRN: [id number], DOB: 02/02/1986, 32 y.o. Date of Encounter: 05/20/2018, 4:01 PM    Chief Complaint:  Chief Complaint  Patient presents with  . left ear ache    headache   . right eye discomfort     HPI: 32 y.o. year old female presents with above.   Reports that her left ear has been bothering her for about 2 weeks.  Says she has been feeling ache like a headache there and throbbing. States that late yesterday afternoon her right eye started bothering her.  Became irritated.  Started to develop a mucousy drainage.  This morning had golden crust on eyelashes. Last night started to develop sore throat and just feeling bad all over. Her work sent her home today.     Home Meds:   Outpatient Medications Prior to Visit  Medication Sig Dispense Refill  . cetirizine (ZYRTEC) 10 MG tablet Take 1 tablet (10 mg total) by mouth daily. (Patient not taking: Reported on 05/20/2018) 30 tablet 11  . etonogestrel (NEXPLANON) 68 MG IMPL implant 1 each by Subdermal route once.    . montelukast (SINGULAIR) 10 MG tablet Take 1 tablet (10 mg total) by mouth at bedtime. (Patient not taking: Reported on 05/20/2018) 30 tablet 3  . predniSONE (DELTASONE) 20 MG tablet 2 tabs poqday 1-3, 1 tabs poqday 4-6 9 tablet 0   No facility-administered medications prior to visit.     Allergies: No Known Allergies    Review of Systems: See HPI for pertinent ROS. All other ROS negative.    Physical Exam: Blood pressure 122/84, pulse 80, temperature 98.3 F (36.8 C), temperature source Oral, resp. rate 18, height 5\' 3"  (1.6 m), weight 67.8 kg, SpO2 100 %, unknown if currently breastfeeding., Body mass index is 26.47 kg/m. General:  WF. Appears in no acute distress. HEENT: Normocephalic, atraumatic. Right eye with diffuse conjunctival injection. Left eye normal on inspection. nares are without discharge. Bilateral auditory canals clear. Left TM dull. Right TM with areas of white scar.    Oral cavity moist, posterior pharynx with moderate erythema.  No exudate, no peritonsillar abscess.  Neck: Supple. No thyromegaly. No lymphadenopathy. Lungs: Clear bilaterally to auscultation without wheezes, rales, or rhonchi. Breathing is unlabored. Heart: Regular rhythm. No murmurs, rubs, or gallops. Msk:  Strength and tone normal for age. Extremities/Skin: Warm and dry.  Neuro: Alert and oriented X 3. Moves all extremities spontaneously. Gait is normal. CNII-XII grossly in tact. Psych:  Responds to questions appropriately with a normal affect.     ASSESSMENT AND PLAN:  32 y.o. year old female with   1. Bacterial respiratory infection She  is to take antibiotic as directed.  Follow-up if symptoms do not resolve after completion of antibiotic. - amoxicillin (AMOXIL) 875 MG tablet; Take 1 tablet (875 mg total) by mouth 2 (two) times daily.  Dispense: 14 tablet; Refill: 0  2. Acute conjunctivitis of right eye, unspecified acute conjunctivitis type She is to apply eyedrops as directed.  Follow-up if worsen significantly or does not resolve within 1 week.  Also discussed proper hygiene. - sulfacetamide (BLEPH-10) 10 % ophthalmic solution; Place 2 drops into the right eye 4 (four) times daily for 10 days.  Dispense: 5 mL; Refill: 0  Also note given to cover being out of work today and tomorrow.  Will return to work on Tuesday as Monday is Labor Day and she is off work that day.   Signed, Shon Hale Robinhood, [place],  BSFM 05/20/2018 4:01 PM

## 2018-05-21 ENCOUNTER — Ambulatory Visit: Payer: BLUE CROSS/BLUE SHIELD | Admitting: Family Medicine

## 2018-05-26 DIAGNOSIS — L309 Dermatitis, unspecified: Secondary | ICD-10-CM | POA: Diagnosis not present

## 2018-05-31 ENCOUNTER — Telehealth: Payer: Self-pay | Admitting: *Deleted

## 2018-05-31 DIAGNOSIS — M310 Hypersensitivity angiitis: Secondary | ICD-10-CM | POA: Diagnosis not present

## 2018-05-31 MED ORDER — SULFACETAMIDE SODIUM 10 % OP SOLN: 1.0000 [drp] | OPHTHALMIC | 0 refills | Status: DC

## 2018-05-31 NOTE — Telephone Encounter (Signed)
Prescription sent to pharmacy.

## 2018-05-31 NOTE — Telephone Encounter (Signed)
Ok with Bleph-10 refill.

## 2018-05-31 NOTE — Telephone Encounter (Signed)
Received call from patient.   Reports that she was seen on 8/29/2019by PA for URI and conjunctivitis on R eye. States that she was given sulfacetamide (BLEPH-10) 10 % ophthalmic solution. Reports that she has noted L eye to now have increased itching, crusting with yellow/green mucus, and redness to surrounding skin.  Appointment scheduled for 06/01/2018. Patient is unable to go to work at this time with eye irritation. Requested noted for 05/31/2018. Also requested refill on eye drops until appointment.   MD please advise.

## 2018-06-01 ENCOUNTER — Ambulatory Visit (INDEPENDENT_AMBULATORY_CARE_PROVIDER_SITE_OTHER): Payer: BLUE CROSS/BLUE SHIELD | Admitting: Family Medicine

## 2018-06-01 ENCOUNTER — Encounter: Payer: Self-pay | Admitting: Family Medicine

## 2018-06-01 ENCOUNTER — Encounter: Payer: Self-pay | Admitting: Physician Assistant

## 2018-06-01 VITALS — BP 132/84 | HR 82 | Temp 98.0°F | Resp 18 | Ht 63.0 in | Wt 151.0 lb

## 2018-06-01 DIAGNOSIS — J00 Acute nasopharyngitis [common cold]: Secondary | ICD-10-CM

## 2018-06-01 MED ORDER — LEVOCETIRIZINE DIHYDROCHLORIDE 5 MG PO TABS: 5.0000 mg | ORAL_TABLET | Freq: Every evening | ORAL | 0 refills | Status: DC

## 2018-06-01 MED ORDER — BENZONATATE 100 MG PO CAPS: 200.0000 mg | ORAL_CAPSULE | Freq: Three times a day (TID) | ORAL | 0 refills | Status: DC | PRN

## 2018-06-01 NOTE — Progress Notes (Signed)
Subjective:    Patient ID: Janice Carney, female    DOB: 04/23/1986, 32 y.o.   MRN: [id number]  HPI Patient was seen last week for a respiratory infection and conjunctivitis.  The redness is spread now to her left eye.  There is mild left conjunctival erythema.  There is no visible exudate today.  There is no pain with extraocular movement and no light sensitivity.  She does report persistent nonproductive cough.  She describes it as a tickle in the back of her throat.  She is afebrile.  There is no chest pain.  There is no shortness of breath.  There is no purulent sputum.  There is head congestion and postnasal drip.  There is no sinus pain.  There is no erythema in the posterior oropharynx and there is no severe sore throat Past Medical History:  Diagnosis Date  . Anemia   . No pertinent past medical history   . Ovarian cyst    Past Surgical History:  Procedure Laterality Date  . OVARIAN CYST SURGERY     Current Outpatient Medications on File Prior to Visit  Medication Sig Dispense Refill  . etonogestrel (NEXPLANON) 68 MG IMPL implant 1 each by Subdermal route once.    . sulfacetamide (BLEPH-10) 10 % ophthalmic solution Place 1 drop into the left eye every 3 (three) hours. 15 mL 0   No current facility-administered medications on file prior to visit.    No Known Allergies Social History   Socioeconomic History  . Marital status: Single    Spouse name: Not on file  . Number of children: Not on file  . Years of education: Not on file  . Highest education level: Not on file  Occupational History  . Not on file  Social Needs  . Financial resource strain: Not on file  . Food insecurity:    Worry: Not on file    Inability: Not on file  . Transportation needs:    Medical: Not on file    Non-medical: Not on file  Tobacco Use  . Smoking status: Former Smoker    Years: 5.00    Types: Cigarettes    Last attempt to quit: 07/19/2005    Years since quitting: 12.8  . Smokeless  tobacco: Never Used  Substance and Sexual Activity  . Alcohol use: Yes    Comment: socially   . Drug use: No  . Sexual activity: Yes  Lifestyle  . Physical activity:    Days per week: Not on file    Minutes per session: Not on file  . Stress: Not on file  Relationships  . Social connections:    Talks on phone: Not on file    Gets together: Not on file    Attends religious service: Not on file    Active member of club or organization: Not on file    Attends meetings of clubs or organizations: Not on file    Relationship status: Not on file  . Intimate partner violence:    Fear of current or ex partner: Not on file    Emotionally abused: Not on file    Physically abused: Not on file    Forced sexual activity: Not on file  Other Topics Concern  . Not on file  Social History Narrative  . Not on file      Review of Systems  All other systems reviewed and are negative.      Objective:   Physical Exam  Constitutional:  She appears well-developed and well-nourished. No distress.  HENT:  Right Ear: Tympanic membrane, external ear and ear canal normal.  Left Ear: Tympanic membrane, external ear and ear canal normal.  Nose: Mucosal edema and rhinorrhea present. Right sinus exhibits no maxillary sinus tenderness and no frontal sinus tenderness. Left sinus exhibits no maxillary sinus tenderness and no frontal sinus tenderness.  Mouth/Throat: Oropharynx is clear and moist. No oropharyngeal exudate, posterior oropharyngeal edema or posterior oropharyngeal erythema.  Eyes: Left eye exhibits no exudate. Left conjunctiva is injected.  Cardiovascular: Normal rate, regular rhythm and normal heart sounds.  Pulmonary/Chest: Effort normal and breath sounds normal. No stridor. No respiratory distress. She has no wheezes. She has no rales.  Skin: She is not diaphoretic.          Assessment & Plan:  Acute nasopharyngitis  Patient appears to have a viral upper respiratory  infection/cold.  I recommended tincture of time.  Use Xyzal for head congestion 5 mg a day.  Can use Tessalon Perles 200 mg every 8 hours as needed for cough.  Could supplement with Mucinex or Sudafed as needed.  Symptoms should improve gradually on their own over the next 3 to 4 days.  I see no indication for an antibiotic.  She is already been on amoxicillin with no improvement most likely because this appears to be viral.  I also do not think that she has bacterial conjunctivitis.  I believe is most likely viral conjunctivitis secondary most likely to adenovirus.  I have recommended tincture of time, handwashing, and rest

## 2018-06-08 ENCOUNTER — Other Ambulatory Visit: Payer: Self-pay | Admitting: Family Medicine

## 2018-06-08 MED ORDER — LEVOCETIRIZINE DIHYDROCHLORIDE 5 MG PO TABS: ORAL_TABLET | ORAL | 1 refills | Status: DC

## 2018-06-08 NOTE — Addendum Note (Signed)
Addended by: Phillips OdorSIX, Benna Arno H on: 06/08/2018 02:13 PM   Modules accepted: Orders

## 2018-06-09 DIAGNOSIS — M310 Hypersensitivity angiitis: Secondary | ICD-10-CM | POA: Diagnosis not present

## 2018-06-09 DIAGNOSIS — M129 Arthropathy, unspecified: Secondary | ICD-10-CM | POA: Diagnosis not present

## 2018-06-10 DIAGNOSIS — M310 Hypersensitivity angiitis: Secondary | ICD-10-CM | POA: Diagnosis not present

## 2018-07-07 DIAGNOSIS — M310 Hypersensitivity angiitis: Secondary | ICD-10-CM | POA: Diagnosis not present

## 2018-11-19 DIAGNOSIS — R509 Fever, unspecified: Secondary | ICD-10-CM | POA: Diagnosis not present

## 2018-11-19 DIAGNOSIS — J101 Influenza due to other identified influenza virus with other respiratory manifestations: Secondary | ICD-10-CM | POA: Diagnosis not present

## 2019-04-01 DIAGNOSIS — I776 Arteritis, unspecified: Secondary | ICD-10-CM | POA: Diagnosis not present

## 2019-04-01 DIAGNOSIS — M79672 Pain in left foot: Secondary | ICD-10-CM | POA: Diagnosis not present

## 2019-04-14 DIAGNOSIS — I776 Arteritis, unspecified: Secondary | ICD-10-CM | POA: Diagnosis not present

## 2019-04-14 DIAGNOSIS — G5600 Carpal tunnel syndrome, unspecified upper limb: Secondary | ICD-10-CM | POA: Diagnosis not present

## 2019-04-14 DIAGNOSIS — M79672 Pain in left foot: Secondary | ICD-10-CM | POA: Diagnosis not present

## 2019-04-14 DIAGNOSIS — L959 Vasculitis limited to the skin, unspecified: Secondary | ICD-10-CM | POA: Diagnosis not present

## 2019-04-14 DIAGNOSIS — M310 Hypersensitivity angiitis: Secondary | ICD-10-CM | POA: Diagnosis not present

## 2019-04-14 DIAGNOSIS — R21 Rash and other nonspecific skin eruption: Secondary | ICD-10-CM | POA: Diagnosis not present

## 2019-04-16 ENCOUNTER — Other Ambulatory Visit: Payer: Self-pay

## 2019-04-16 ENCOUNTER — Encounter (HOSPITAL_COMMUNITY): Payer: Self-pay | Admitting: Emergency Medicine

## 2019-04-16 ENCOUNTER — Emergency Department (HOSPITAL_COMMUNITY)
Admission: EM | Admit: 2019-04-16 | Discharge: 2019-04-16 | Disposition: A | Discharge status: Home / Self Care | Payer: BC Managed Care – PPO | Attending: Emergency Medicine | Admitting: Emergency Medicine

## 2019-04-16 ENCOUNTER — Emergency Department (HOSPITAL_COMMUNITY): Payer: BC Managed Care – PPO

## 2019-04-16 DIAGNOSIS — Z87891 Personal history of nicotine dependence: Secondary | ICD-10-CM | POA: Diagnosis not present

## 2019-04-16 DIAGNOSIS — M545 Low back pain: Secondary | ICD-10-CM | POA: Diagnosis not present

## 2019-04-16 DIAGNOSIS — R8281 Pyuria: Secondary | ICD-10-CM

## 2019-04-16 DIAGNOSIS — L959 Vasculitis limited to the skin, unspecified: Secondary | ICD-10-CM | POA: Insufficient documentation

## 2019-04-16 DIAGNOSIS — X58XXXA Exposure to other specified factors, initial encounter: Secondary | ICD-10-CM | POA: Insufficient documentation

## 2019-04-16 DIAGNOSIS — S39012A Strain of muscle, fascia and tendon of lower back, initial encounter: Secondary | ICD-10-CM | POA: Insufficient documentation

## 2019-04-16 DIAGNOSIS — T148XXA Other injury of unspecified body region, initial encounter: Secondary | ICD-10-CM

## 2019-04-16 DIAGNOSIS — Y998 Other external cause status: Secondary | ICD-10-CM | POA: Insufficient documentation

## 2019-04-16 DIAGNOSIS — Y9389 Activity, other specified: Secondary | ICD-10-CM | POA: Diagnosis not present

## 2019-04-16 DIAGNOSIS — Y9289 Other specified places as the place of occurrence of the external cause: Secondary | ICD-10-CM | POA: Insufficient documentation

## 2019-04-16 DIAGNOSIS — R109 Unspecified abdominal pain: Secondary | ICD-10-CM | POA: Diagnosis not present

## 2019-04-16 DIAGNOSIS — Z79899 Other long term (current) drug therapy: Secondary | ICD-10-CM | POA: Insufficient documentation

## 2019-04-16 LAB — URINALYSIS, ROUTINE W REFLEX MICROSCOPIC
Bacteria, UA: NONE SEEN
Bilirubin Urine: NEGATIVE
Glucose, UA: NEGATIVE mg/dL
Hgb urine dipstick: NEGATIVE
Ketones, ur: NEGATIVE mg/dL
Nitrite: NEGATIVE
Protein, ur: NEGATIVE mg/dL
Specific Gravity, Urine: 1.026 (ref 1.005–1.030)
pH: 5 (ref 5.0–8.0)

## 2019-04-16 LAB — PREGNANCY, URINE: Preg Test, Ur: NEGATIVE

## 2019-04-16 MED ORDER — METHOCARBAMOL 500 MG PO TABS: 500.0000 mg | ORAL_TABLET | Freq: Two times a day (BID) | ORAL | 0 refills | Status: DC | PRN

## 2019-04-16 MED ORDER — CEPHALEXIN 500 MG PO CAPS: 500.0000 mg | ORAL_CAPSULE | Freq: Two times a day (BID) | ORAL | 0 refills | Status: AC

## 2019-04-16 MED ORDER — KETOROLAC TROMETHAMINE 60 MG/2ML IM SOLN
60.0000 mg | Freq: Once | INTRAMUSCULAR | Status: AC
  Administered 2019-04-16: 60 mg via INTRAMUSCULAR
  Filled 2019-04-16: qty 2

## 2019-04-16 MED ORDER — NAPROXEN 500 MG PO TABS: 500.0000 mg | ORAL_TABLET | Freq: Two times a day (BID) | ORAL | 0 refills | Status: DC

## 2019-04-16 MED ORDER — METHOCARBAMOL 500 MG PO TABS
500.0000 mg | ORAL_TABLET | ORAL | Status: AC
  Administered 2019-04-16: 500 mg via ORAL
  Filled 2019-04-16: qty 1

## 2019-04-16 NOTE — Discharge Instructions (Addendum)
Your x-ray did not show any signs of damage to your spine or abnormal findings.  Your urinalysis did reveal some blood cells that could indicate an early infection.  Please take the following medications as prescribed  Cephalexin, 500 mg twice daily for 5 days Naprosyn, 500 mg twice daily as needed for pain Robaxin, 500 mg every 8 hours as needed for muscle spasm  I would encourage you to rest and apply ice intermittently to help with some of the back pain.  Some people benefit from topical medication such as diclofenac gel which is now available over-the-counter.  Seek medical exam for severe or worsening pain, vomiting, fever or any numbness or weakness of the legs.

## 2019-04-16 NOTE — ED Provider Notes (Signed)
[facility] EMERGENCY DEPARTMENT Provider Note   CSN: [id number] Arrival date & time: 04/16/19  1610    History   Chief Complaint Chief Complaint  Patient presents with  . Back Pain    HPI Janice Carney is a 33 y.o. female.     HPI  This patient is a very pleasant 33 year old female with a prior history of leukocytoclastic vasculitis.  She has no prior abdominal or back surgery history, no history of kidney stones or significant urinary tract infections.  She presents with a complaint of left-sided flank pain which started yesterday.  It was acute in onset, would come and go intermittently without instigating factors or relieving factors but today the pain is more constant.  It is definitely worse with any movements including rotation at the hips, bending, straightening or palpation.  It does not radiate, it does not radiate to the abdomen the buttocks or down the legs and there is no associated nausea, vomiting, numbness, weakness, fever, chills, rashes.  She has not had any urinary symptoms including dysuria, hematuria, frequency or urgency.  No changes in bowel habits including diarrhea or constipation.  She denies any prior significant symptoms similar to this.  She denies any recent heavy lifting or strain.  She denies any recent injury.  She does not have a history of back pain.  She has taken no medications prior to arrival.  Past Medical History:  Diagnosis Date  . Anemia   . No pertinent past medical history   . Ovarian cyst     Patient Active Problem List   Diagnosis Date Noted  . Carpal tunnel syndrome, bilateral 09/25/2017  . Family history of colon cancer in father 09/21/2017  . NSVD (normal spontaneous vaginal delivery) 06/19/2013  . Second-degree perineal laceration, with delivery 06/19/2013  . Anemia 06/19/2013  . Vaginitis and vulvovaginitis 06/17/2013  . Uterine size date discrepancy 06/17/2013  . Polyhydramnios 06/17/2013  . ADNEXAL MASS 06/19/2010  .  VIRAL INFECTION 06/04/2010  . SYNDROME, PELVIC CONGESTION 04/22/2010    Past Surgical History:  Procedure Laterality Date  . OVARIAN CYST SURGERY       OB History    Gravida  2   Para  2   Term  2   Preterm      AB      Living  2     SAB      TAB      Ectopic      Multiple      Live Births  2            Home Medications    Prior to Admission medications   Medication Sig Start Date End Date Taking? Authorizing Provider  levocetirizine (XYZAL) 5 MG tablet TAKE 1 TABLET BY MOUTH EVERY DAY IN THE EVENING 06/08/18  Yes Pickard, Cammie Mcgee, MD  cephALEXin (KEFLEX) 500 MG capsule Take 1 capsule (500 mg total) by mouth 2 (two) times daily for 5 days. 04/16/19 04/21/19  Noemi Chapel, MD  etonogestrel (NEXPLANON) 68 MG IMPL implant 1 each by Subdermal route once.    [provider]  methocarbamol (ROBAXIN) 500 MG tablet Take 1 tablet (500 mg total) by mouth 2 (two) times daily as needed for muscle spasms. 04/16/19   Noemi Chapel, MD  naproxen (NAPROSYN) 500 MG tablet Take 1 tablet (500 mg total) by mouth 2 (two) times daily with a meal. 04/16/19   Noemi Chapel, MD    Family History Family History  Problem  Relation Age of Onset  . Hypertension Mother   . Cancer Father        Colon Cancer at ~ age 34  . Heart disease Maternal Grandmother   . Breast cancer Paternal Grandmother     Social History Social History   Tobacco Use  . Smoking status: Former Smoker    Years: 5.00    Types: Cigarettes    Quit date: 07/19/2005    Years since quitting: 13.7  . Smokeless tobacco: Never Used  Substance Use Topics  . Alcohol use: Yes    Comment: socially   . Drug use: No     Allergies   Patient has no known allergies.   Review of Systems Review of Systems  All other systems reviewed and are negative.    Physical Exam Updated Vital Signs BP (!) 125/94 (BP Location: Right Arm)   Pulse 88   Temp 98.3 F (36.8 C) (Oral)   Resp 12   Ht 1.6 m (5\' 3" )    Wt 70.3 kg   SpO2 100%   BMI 27.46 kg/m   Physical Exam Vitals signs and nursing note reviewed.  Constitutional:      General: She is not in acute distress.    Appearance: She is well-developed.  HENT:     Head: Normocephalic and atraumatic.     Mouth/Throat:     Pharynx: No oropharyngeal exudate.  Eyes:     General: No scleral icterus.       Right eye: No discharge.        Left eye: No discharge.     Conjunctiva/sclera: Conjunctivae normal.     Pupils: Pupils are equal, round, and reactive to light.  Neck:     Musculoskeletal: Normal range of motion and neck supple.     Thyroid: No thyromegaly.     Vascular: No JVD.  Cardiovascular:     Rate and Rhythm: Normal rate and regular rhythm.     Heart sounds: Normal heart sounds. No murmur. No friction rub. No gallop.   Pulmonary:     Effort: Pulmonary effort is normal. No respiratory distress.     Breath sounds: Normal breath sounds. No wheezing or rales.  Abdominal:     General: Bowel sounds are normal. There is no distension.     Palpations: Abdomen is soft. There is no mass.     Tenderness: There is no abdominal tenderness.  Musculoskeletal: Normal range of motion.        General: Tenderness present.     Comments: The patient is able to range of motion all 4 extremities without any difficulty.  When she lifts her left leg she does have some pain in the left flank.  There is also reproducible tenderness over the left CVA region.  There is no tenderness over the lumbar or thoracic spines.  There is no tenderness in the upper buttock.  Lymphadenopathy:     Cervical: No cervical adenopathy.  Skin:    General: Skin is warm and dry.     Findings: No erythema or rash.  Neurological:     Mental Status: She is alert.     Coordination: Coordination normal.     Comments: Gait is antalgic secondary to pain however the patient is able to ambulate, she is able to move all 4 extremities.  Her speech is clear memory is intact.   Psychiatric:        Behavior: Behavior normal.      ED Treatments /  Results  Labs (all labs ordered are listed, but only abnormal results are displayed) Labs Reviewed  URINALYSIS, ROUTINE W REFLEX MICROSCOPIC - Abnormal; Notable for the following components:      Result Value   APPearance HAZY (*)    Leukocytes,Ua SMALL (*)    All other components within normal limits  PREGNANCY, URINE    EKG None  Radiology Dg Lumbar Spine Complete  Result Date: 04/16/2019 CLINICAL DATA:  Left-sided back pain beginning yesterday. No known injury. EXAM: LUMBAR SPINE - COMPLETE 4+ VIEW COMPARISON:  None. FINDINGS: There is no evidence of lumbar spine fracture. Alignment is normal. Intervertebral disc spaces are maintained. No evidence of facet arthropathy or other bone lesions. IMPRESSION: Negative. Electronically Signed   By: Danae OrleansJohn A Stahl M.D.   On: 04/16/2019 18:39    Procedures Procedures (including critical care time)  Medications Ordered in ED Medications  ketorolac (TORADOL) injection 60 mg (60 mg Intramuscular Given 04/16/19 1726)  methocarbamol (ROBAXIN) tablet 500 mg (500 mg Oral Given 04/16/19 1726)     Initial Impression / Assessment and Plan / ED Course  I have reviewed the triage vital signs and the nursing notes.  Pertinent labs & imaging results that were available during my care of the patient were reviewed by me and considered in my medical decision making (see chart for details).  Clinical Course as of Apr 15 1842  Sat Apr 16, 2019  [id number] My interpretation of the x-ray is that there is no acute findings including fractures or dislocations.  The patient has received Toradol and Robaxin, she feels a little bit better.  She does not have any neurologic symptoms.  Awaiting official radiology read.  She does have between 11 and 20 white blood cells on her urinalysis, this will be cultured and she will be treated with an antibiotic.   [BM]    Clinical Course User Index [BM]  Eber HongMiller, Serinity Ware, MD       This patient has a new back pain which appears to be lateral, seems to be muscular, given the intermittent and severe nature of it we will obtain a urinalysis to rule out blood or infection however this does not appear to be bony or spinal and there is no neurologic compromise.  Patient is agreeable to Toradol, Robaxin.  Doubt aneurysm or intra-abdominal pathology  Xray read as netgative - I concur, pt informed - stable for d/c.  Final Clinical Impressions(s) / ED Diagnoses   Final diagnoses:  Acute left flank pain  Muscle strain  Pyuria    ED Discharge Orders         Ordered    cephALEXin (KEFLEX) 500 MG capsule  2 times daily     04/16/19 1841    naproxen (NAPROSYN) 500 MG tablet  2 times daily with meals     04/16/19 1843    methocarbamol (ROBAXIN) 500 MG tablet  2 times daily PRN     04/16/19 1843           Eber HongMiller, Najae Rathert, MD 04/16/19 1843

## 2019-04-16 NOTE — ED Triage Notes (Addendum)
Patient complains of left lower back pain that started yesterday. Patient states it feels like, "it feels like it is sharp and stretching when I sit down." She states constant sharp pain all other times.  Patient states she has as history of leukocytoclastic vasculitis. She was told by her PCP that she needed to go to the ED directly.

## 2019-04-29 DIAGNOSIS — L959 Vasculitis limited to the skin, unspecified: Secondary | ICD-10-CM | POA: Diagnosis not present

## 2019-05-19 DIAGNOSIS — L959 Vasculitis limited to the skin, unspecified: Secondary | ICD-10-CM | POA: Diagnosis not present

## 2019-07-26 DIAGNOSIS — Z3046 Encounter for surveillance of implantable subdermal contraceptive: Secondary | ICD-10-CM | POA: Diagnosis not present

## 2019-08-02 ENCOUNTER — Other Ambulatory Visit: Payer: Self-pay

## 2019-08-02 DIAGNOSIS — Z20822 Contact with and (suspected) exposure to covid-19: Secondary | ICD-10-CM

## 2019-08-04 LAB — NOVEL CORONAVIRUS, NAA: SARS-CoV-2, NAA: NOT DETECTED

## 2019-11-17 DIAGNOSIS — H5213 Myopia, bilateral: Secondary | ICD-10-CM | POA: Diagnosis not present

## 2019-12-22 DIAGNOSIS — Z20828 Contact with and (suspected) exposure to other viral communicable diseases: Secondary | ICD-10-CM | POA: Diagnosis not present

## 2020-03-05 ENCOUNTER — Other Ambulatory Visit: Payer: Self-pay

## 2020-03-05 ENCOUNTER — Ambulatory Visit: Payer: BC Managed Care – PPO | Admitting: Nurse Practitioner

## 2020-03-05 VITALS — BP 120/76 | HR 84 | Temp 97.6°F | Resp 18 | Wt 159.4 lb

## 2020-03-05 DIAGNOSIS — K625 Hemorrhage of anus and rectum: Secondary | ICD-10-CM

## 2020-03-05 DIAGNOSIS — K649 Unspecified hemorrhoids: Secondary | ICD-10-CM

## 2020-03-05 LAB — POC HEMOCCULT BLD/STL (OFFICE/1-CARD/DIAGNOSTIC): Card #1 Date: 6142021

## 2020-03-05 MED ORDER — HYDROCORTISONE ACETATE 25 MG RE SUPP: 25.0000 mg | Freq: Two times a day (BID) | RECTAL | 0 refills | Status: DC

## 2020-03-05 MED ORDER — HYDROCORTISONE ACETATE 25 MG RE SUPP: 25.0000 mg | Freq: Two times a day (BID) | RECTAL | 1 refills | Status: AC

## 2020-03-05 NOTE — Patient Instructions (Signed)
Avoid constipation. Get plenty of fiber in your diet, consider daily fiber supplement once a day.  Drink plenty of water daily. 2 weeks hydrocortisone suppository rectally as directed.

## 2020-03-05 NOTE — Progress Notes (Signed)
Established Patient Office Visit  Subjective:  Patient ID: Janice Carney, female    DOB: [birth year]  Age: 34 y.o. MRN: [id number]  CC:  Chief Complaint  Patient presents with  . Rectal Bleeding    x2 months, on 06/14 more bleeding    HPI Janice Carney is a 34 year old female presenting for rectal bleed when she has a BM for the past 2 weeks. She has no known history of hemorrhoid, GI conditions, rectal bleed. H/o vaginal delivery of her children. The pt reports that she does not have daily BM and at times does have constipation. She denied itching or burning. She has tried no treatment. No diarrhea or vomiting, No abdominal pain or change in appetite. No recent change in weight.  Past Medical History:  Diagnosis Date  . Anemia   . No pertinent past medical history   . Ovarian cyst     Past Surgical History:  Procedure Laterality Date  . OVARIAN CYST SURGERY      Family History  Problem Relation Age of Onset  . Hypertension Mother   . Cancer Father        Colon Cancer at ~ age 103  . Heart disease Maternal Grandmother   . Breast cancer Paternal Grandmother     Social History   Socioeconomic History  . Marital status: Single    Spouse name: Not on file  . Number of children: Not on file  . Years of education: Not on file  . Highest education level: Not on file  Occupational History  . Not on file  Tobacco Use  . Smoking status: Former Smoker    Years: 5.00    Types: Cigarettes    Quit date: 07/19/2005    Years since quitting: 14.6  . Smokeless tobacco: Never Used  Substance and Sexual Activity  . Alcohol use: Yes    Comment: socially   . Drug use: No  . Sexual activity: Yes  Other Topics Concern  . Not on file  Social History Narrative  . Not on file   Social Determinants of Health   Financial Resource Strain:   . Difficulty of Paying Living Expenses:   Food Insecurity:   . Worried About Programme researcher, broadcasting/film/video in the Last Year:   . Barista  in the Last Year:   Transportation Needs:   . Freight forwarder (Medical):   Marland Kitchen Lack of Transportation (Non-Medical):   Physical Activity:   . Days of Exercise per Week:   . Minutes of Exercise per Session:   Stress:   . Feeling of Stress :   Social Connections:   . Frequency of Communication with Friends and Family:   . Frequency of Social Gatherings with Friends and Family:   . Attends Religious Services:   . Active Member of Clubs or Organizations:   . Attends Banker Meetings:   Marland Kitchen Marital Status:   Intimate Partner Violence:   . Fear of Current or Ex-Partner:   . Emotionally Abused:   Marland Kitchen Physically Abused:   . Sexually Abused:     Outpatient Medications Prior to Visit  Medication Sig Dispense Refill  . etonogestrel (NEXPLANON) 68 MG IMPL implant 1 each by Subdermal route once.    Marland Kitchen levocetirizine (XYZAL) 5 MG tablet TAKE 1 TABLET BY MOUTH EVERY DAY IN THE EVENING 90 tablet 1  . methocarbamol (ROBAXIN) 500 MG tablet Take 1 tablet (500 mg total) by mouth 2 (  two) times daily as needed for muscle spasms. 20 tablet 0  . naproxen (NAPROSYN) 500 MG tablet Take 1 tablet (500 mg total) by mouth 2 (two) times daily with a meal. 30 tablet 0   No facility-administered medications prior to visit.    No Known Allergies  ROS Review of Systems  All other systems reviewed and are negative.     Objective:    Physical Exam Vitals and nursing note reviewed. Exam conducted with a chaperone present.  Constitutional:      Appearance: Normal appearance.  HENT:     Head: Normocephalic.     Right Ear: External ear normal.     Left Ear: External ear normal.     Nose: Nose normal.  Eyes:     Extraocular Movements: Extraocular movements intact.     Conjunctiva/sclera: Conjunctivae normal.     Pupils: Pupils are equal, round, and reactive to light.  Genitourinary:      Comments: Rectal examination reveals non thrombosed hemorhoid at 10 o clock. Digital rectal exam  reveals internal hemorhoid at 3 o clock. Ifob positive bright red blood minimal .  Neurological:     Mental Status: She is alert.     BP 120/76 (BP Location: Left Arm, Patient Position: Sitting, Cuff Size: Normal)   Pulse 84   Temp 97.6 F (36.4 C) (Temporal)   Resp 18   Wt 159 lb 6.4 oz (72.3 kg)   SpO2 98%   BMI 28.24 kg/m  Wt Readings from Last 3 Encounters:  03/05/20 159 lb 6.4 oz (72.3 kg)  04/16/19 155 lb (70.3 kg)  06/01/18 151 lb (68.5 kg)     Health Maintenance Due  Topic Date Due  . Hepatitis C Screening  Never done  . COVID-19 Vaccine (1) Never done  . PAP SMEAR-Modifier  Never done    There are no preventive care reminders to display for this patient.  Lab Results  Component Value Date   TSH 2.206 04/22/2010   Lab Results  Component Value Date   WBC 5.2 02/19/2018   HGB 13.2 02/19/2018   HCT 38.3 02/19/2018   MCV 88.0 02/19/2018   PLT 275 02/19/2018   Lab Results  Component Value Date   NA 140 02/19/2018   K 5.3 02/19/2018   CO2 25 02/19/2018   GLUCOSE 80 02/19/2018   BUN 15 02/19/2018   CREATININE 0.78 02/19/2018   BILITOT 0.5 02/19/2018   ALKPHOS 89 06/24/2017   AST 17 02/19/2018   ALT 14 02/19/2018   PROT 6.8 02/19/2018   ALBUMIN 3.7 06/24/2017   CALCIUM 9.1 02/19/2018   ANIONGAP 9 06/24/2017   No results found for: CHOL No results found for: HDL No results found for: LDLCALC No results found for: TRIG No results found for: CHOLHDL No results found for: NLGX2J    Assessment & Plan:   Problem List Items Addressed This Visit    None    Visit Diagnoses    Rectal bleed    -  Primary   Relevant Orders   POC Hemoccult Bld/Stl (1-Cd Office Dx)   Hemorrhoids, unspecified hemorrhoid type       Relevant Medications   hydrocortisone (ANUSOL-HC) 25 MG suppository    Avoid constipation. Get plenty of fiber in your diet, consider daily fiber supplement once a day.  Drink plenty of water daily. 2 weeks hydrocortisone suppository  rectally as directed.  Meds ordered this encounter  Medications  . DISCONTD: hydrocortisone (ANUSOL-HC) 25 MG suppository  Sig: Place 1 suppository (25 mg total) rectally 2 (two) times daily.    Dispense:  12 suppository    Refill:  0  . hydrocortisone (ANUSOL-HC) 25 MG suppository    Sig: Place 1 suppository (25 mg total) rectally 2 (two) times daily.    Dispense:  28 suppository    Refill:  1    Follow-up: Return if symptoms worsen or fail to improve.    Annie Main, FNP
